# Patient Record
Sex: Female | Born: 1995 | Race: Black or African American | Hispanic: No | Marital: Single | Smoking: Light tobacco smoker
Health system: Southern US, Community
[De-identification: ages and names within clinical notes are randomized; demographics above are authoritative.]

## PROBLEM LIST (undated history)

## (undated) ENCOUNTER — Ambulatory Visit (HOSPITAL_COMMUNITY): Admission: EM | Discharge status: Absent without leave | Payer: Medicaid Other

## (undated) DIAGNOSIS — R109 Unspecified abdominal pain: Secondary | ICD-10-CM

## (undated) DIAGNOSIS — F1190 Opioid use, unspecified, uncomplicated: Secondary | ICD-10-CM

## (undated) DIAGNOSIS — F1220 Cannabis dependence, uncomplicated: Secondary | ICD-10-CM

## (undated) DIAGNOSIS — R51 Headache: Secondary | ICD-10-CM

## (undated) DIAGNOSIS — F913 Oppositional defiant disorder: Secondary | ICD-10-CM

## (undated) DIAGNOSIS — R768 Other specified abnormal immunological findings in serum: Secondary | ICD-10-CM

## (undated) DIAGNOSIS — R110 Nausea: Secondary | ICD-10-CM

## (undated) DIAGNOSIS — B182 Chronic viral hepatitis C: Secondary | ICD-10-CM

## (undated) DIAGNOSIS — L309 Dermatitis, unspecified: Secondary | ICD-10-CM

## (undated) DIAGNOSIS — F329 Major depressive disorder, single episode, unspecified: Secondary | ICD-10-CM

## (undated) DIAGNOSIS — F411 Generalized anxiety disorder: Secondary | ICD-10-CM

## (undated) DIAGNOSIS — R197 Diarrhea, unspecified: Secondary | ICD-10-CM

## (undated) DIAGNOSIS — F1199 Opioid use, unspecified with unspecified opioid-induced disorder: Secondary | ICD-10-CM

## (undated) DIAGNOSIS — F1990 Other psychoactive substance use, unspecified, uncomplicated: Secondary | ICD-10-CM

## (undated) DIAGNOSIS — F4310 Post-traumatic stress disorder, unspecified: Secondary | ICD-10-CM

## (undated) HISTORY — DX: Unspecified abdominal pain: R10.9

## (undated) HISTORY — DX: Nausea: R11.0

## (undated) HISTORY — PX: NO PAST SURGERIES: SHX2092

## (undated) HISTORY — DX: Diarrhea, unspecified: R19.7

---
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
## 2018-07-26 ENCOUNTER — Emergency Department (HOSPITAL_COMMUNITY)
Admission: EM | Admit: 2018-07-26 | Discharge: 2018-07-26 | Disposition: A | Discharge status: Home / Self Care | Payer: Self-pay | Attending: Emergency Medicine | Admitting: Emergency Medicine

## 2018-07-26 ENCOUNTER — Encounter (HOSPITAL_COMMUNITY): Payer: Self-pay | Admitting: Emergency Medicine

## 2018-07-26 DIAGNOSIS — F17210 Nicotine dependence, cigarettes, uncomplicated: Secondary | ICD-10-CM | POA: Insufficient documentation

## 2018-07-26 DIAGNOSIS — K029 Dental caries, unspecified: Secondary | ICD-10-CM | POA: Insufficient documentation

## 2018-07-26 MED ORDER — MELOXICAM 7.5 MG PO TABS: 7.5000 mg | ORAL_TABLET | Freq: Every day | ORAL | 0 refills | Status: AC

## 2018-07-26 MED ORDER — PENICILLIN V POTASSIUM 500 MG PO TABS: 500.0000 mg | ORAL_TABLET | Freq: Four times a day (QID) | ORAL | 0 refills | Status: AC

## 2018-07-26 NOTE — Discharge Instructions (Addendum)
Pain- try taking to a liquid gels along with 100 mg Tylenol every 6 hours.  If this does not help then take meloxicam as prescribed. Take penicillin as prescribed and complete the full course. Rinse and spit with Listerine or salt water after every meal. Continue to brush with a soft toothbrush. Up with a dentist as soon as possible, see resource list and discharge instructions.

## 2018-07-26 NOTE — ED Notes (Signed)
Patient able to ambulate independently  

## 2018-07-26 NOTE — ED Triage Notes (Signed)
Pt presents to ED for assessment of right lower dental pain x 3 days, hx of abscesses.

## 2018-07-26 NOTE — ED Provider Notes (Signed)
[facility] EMERGENCY DEPARTMENT Provider Note   CSN: [id number] Arrival date & time: 07/26/18  1807     History   Chief Complaint Chief Complaint  Patient presents with  . Dental Pain    HPI Molly Marshall is a 22 y.o. female.  22 year old female presents with complaint of right lower dental pain.  Patient states that she knows that she needs a root canal on this tooth however has been able to follow-up with the dentist, reports pain and swelling around the gums for the past 3 days, no drainage, trauma, no fever.  Denies difficulty opening her mouth or swallowing.  No other injuries, complaints or concerns.     Past Medical History:  Diagnosis Date  . Abdominal pain   . Diarrhea   . Eczema 08/11/2013  . Headache(784.0)   . Nausea     Patient Active Problem List   Diagnosis Date Noted  . Cannabis dependence (HCC) 01/31/2014  . Polysubstance abuse (HCC) 01/31/2014  . ODD (oppositional defiant disorder) 01/31/2014  . MDD (major depressive disorder), recurrent episode, moderate (HCC) 01/28/2014  . Chronic motor or vocal tic disorder 08/22/2013  . PTSD (post-traumatic stress disorder) 08/24/2012    Past Surgical History:  Procedure Laterality Date  . NO PAST SURGERIES       OB History   None      Home Medications    Prior to Admission medications   Medication Sig Start Date End Date Taking? Authorizing Provider  FLUoxetine (PROZAC) 20 MG capsule Take 1 capsule (20 mg total) by mouth at bedtime. 02/08/14   Chauncey Mann, MD  meloxicam (MOBIC) 7.5 MG tablet Take 1 tablet (7.5 mg total) by mouth daily for 10 days. 07/26/18 08/05/18  Jeannie Fend, PA-C  OLANZapine (ZYPREXA) 5 MG tablet Take 1 tablet (5 mg total) by mouth at bedtime. 02/08/14   Chauncey Mann, MD  penicillin v potassium (VEETID) 500 MG tablet Take 1 tablet (500 mg total) by mouth 4 (four) times daily for 10 days. 07/26/18 08/05/18  Jeannie Fend, PA-C    Family  History Family History  Problem Relation Age of Onset  . Mental illness Maternal Uncle   . Drug abuse Maternal Uncle     Social History Social History   Tobacco Use  . Smoking status: Current Some Day Smoker  . Smokeless tobacco: Never Used  Substance Use Topics  . Alcohol use: Yes  . Drug use: Yes    Types: Solvent inhalants, Marijuana, MDMA (Ecstacy)    Comment: On and off     Allergies   Patient has no known allergies.   Review of Systems Review of Systems  Constitutional: Negative for chills and fever.  HENT: Positive for dental problem. Negative for facial swelling, trouble swallowing and voice change.   Gastrointestinal: Negative for vomiting.  Musculoskeletal: Negative for neck pain and neck stiffness.  Skin: Negative for rash and wound.  Allergic/Immunologic: Negative for immunocompromised state.  Neurological: Negative for headaches.  Hematological: Negative for adenopathy.  Psychiatric/Behavioral: Negative for confusion.  All other systems reviewed and are negative.    Physical Exam Updated Vital Signs BP 129/85 (BP Location: Right Arm)   Pulse 92   Temp 98.5 F (36.9 C) (Oral)   Resp 14   SpO2 100%   Physical Exam  Constitutional: She is oriented to person, place, and time. She appears well-developed and well-nourished. No distress.  HENT:  Head: Normocephalic and atraumatic.  Mouth/Throat: Uvula is  midline, oropharynx is clear and moist and mucous membranes are normal. No trismus in the jaw. Abnormal dentition. Dental caries present. No dental abscesses. No oropharyngeal exudate.    Neck: Neck supple.  Pulmonary/Chest: Effort normal.  Lymphadenopathy:    She has no cervical adenopathy.  Neurological: She is alert and oriented to person, place, and time.  Skin: Skin is warm and dry. She is not diaphoretic.  Psychiatric: She has a normal mood and affect. Her behavior is normal.  Nursing note and vitals reviewed.    ED Treatments / Results    Labs (all labs ordered are listed, but only abnormal results are displayed) Labs Reviewed - No data to display  EKG None  Radiology No results found.  Procedures Procedures (including critical care time)  Medications Ordered in ED Medications - No data to display   Initial Impression / Assessment and Plan / ED Course  I have reviewed the triage vital signs and the nursing notes.  Pertinent labs & imaging results that were available during my care of the patient were reviewed by me and considered in my medical decision making (see chart for details).      Final Clinical Impressions(s) / ED Diagnoses   Final diagnoses:  Dental caries    ED Discharge Orders         Ordered    penicillin v potassium (VEETID) 500 MG tablet  4 times daily     07/26/18 1937    meloxicam (MOBIC) 7.5 MG tablet  Daily     07/26/18 1937           Alden HippMurphy, Edee Nifong A, PA-C 07/26/18 1941    Raeford RazorKohut, Stephen, MD 08/04/18 [id number]

## 2018-09-18 ENCOUNTER — Emergency Department (HOSPITAL_COMMUNITY): Payer: Self-pay

## 2018-09-18 ENCOUNTER — Emergency Department (HOSPITAL_COMMUNITY)
Admission: EM | Admit: 2018-09-18 | Discharge: 2018-09-18 | Disposition: A | Discharge status: Home / Self Care | Payer: Self-pay | Attending: Emergency Medicine | Admitting: Emergency Medicine

## 2018-09-18 ENCOUNTER — Other Ambulatory Visit: Payer: Self-pay

## 2018-09-18 ENCOUNTER — Encounter (HOSPITAL_COMMUNITY): Payer: Self-pay | Admitting: Emergency Medicine

## 2018-09-18 DIAGNOSIS — F17210 Nicotine dependence, cigarettes, uncomplicated: Secondary | ICD-10-CM | POA: Insufficient documentation

## 2018-09-18 DIAGNOSIS — J0300 Acute streptococcal tonsillitis, unspecified: Secondary | ICD-10-CM | POA: Insufficient documentation

## 2018-09-18 LAB — COMPREHENSIVE METABOLIC PANEL
ALK PHOS: 49 U/L (ref 38–126)
ALT: 11 U/L (ref 0–44)
AST: 17 U/L (ref 15–41)
Albumin: 3.2 g/dL — ABNORMAL LOW (ref 3.5–5.0)
Anion gap: 8 (ref 5–15)
BUN: 9 mg/dL (ref 6–20)
CHLORIDE: 103 mmol/L (ref 98–111)
CO2: 23 mmol/L (ref 22–32)
Calcium: 8.3 mg/dL — ABNORMAL LOW (ref 8.9–10.3)
Creatinine, Ser: 0.91 mg/dL (ref 0.44–1.00)
Glucose, Bld: 118 mg/dL — ABNORMAL HIGH (ref 70–99)
Potassium: 3.3 mmol/L — ABNORMAL LOW (ref 3.5–5.1)
Sodium: 134 mmol/L — ABNORMAL LOW (ref 135–145)
TOTAL PROTEIN: 6.1 g/dL — AB (ref 6.5–8.1)
Total Bilirubin: 0.5 mg/dL (ref 0.3–1.2)

## 2018-09-18 LAB — CBC WITH DIFFERENTIAL/PLATELET
ABS IMMATURE GRANULOCYTES: 0.24 10*3/uL — AB (ref 0.00–0.07)
BASOS PCT: 0 %
Basophils Absolute: 0.1 10*3/uL (ref 0.0–0.1)
Eosinophils Absolute: 0 10*3/uL (ref 0.0–0.5)
Eosinophils Relative: 0 %
HCT: 42.1 % (ref 36.0–46.0)
HEMOGLOBIN: 13.4 g/dL (ref 12.0–15.0)
IMMATURE GRANULOCYTES: 1 %
LYMPHS PCT: 6 %
Lymphs Abs: 1.6 10*3/uL (ref 0.7–4.0)
MCH: 28.5 pg (ref 26.0–34.0)
MCHC: 31.8 g/dL (ref 30.0–36.0)
MCV: 89.6 fL (ref 80.0–100.0)
MONOS PCT: 5 %
Monocytes Absolute: 1.3 10*3/uL — ABNORMAL HIGH (ref 0.1–1.0)
NEUTROS ABS: 22.3 10*3/uL — AB (ref 1.7–7.7)
Neutrophils Relative %: 88 %
Platelets: 315 10*3/uL (ref 150–400)
RBC: 4.7 MIL/uL (ref 3.87–5.11)
RDW: 13.4 % (ref 11.5–15.5)
WBC: 25.5 10*3/uL — AB (ref 4.0–10.5)
nRBC: 0 % (ref 0.0–0.2)

## 2018-09-18 LAB — HCG, QUANTITATIVE, PREGNANCY: hCG, Beta Chain, Quant, S: <1 m[IU]/mL (ref <5)

## 2018-09-18 LAB — I-STAT CG4 LACTIC ACID, ED: Lactic Acid, Venous: 1.54 mmol/L (ref 0.5–1.9)

## 2018-09-18 LAB — GROUP A STREP BY PCR: GROUP A STREP BY PCR: DETECTED — AB

## 2018-09-18 LAB — I-STAT BETA HCG BLOOD, ED (MC, WL, AP ONLY): I-stat hCG, quantitative: 13.8 m[IU]/mL — ABNORMAL HIGH (ref <5)

## 2018-09-18 MED ORDER — PREDNISONE 10 MG PO TABS: 40.0000 mg | ORAL_TABLET | Freq: Every day | ORAL | 0 refills | Status: DC

## 2018-09-18 MED ORDER — IOHEXOL 300 MG/ML  SOLN
75.0000 mL | Freq: Once | INTRAMUSCULAR | Status: AC | PRN
  Administered 2018-09-18: 75 mL via INTRAVENOUS

## 2018-09-18 MED ORDER — ONDANSETRON HCL 4 MG/2ML IJ SOLN
4.0000 mg | Freq: Once | INTRAMUSCULAR | Status: AC
  Administered 2018-09-18: 4 mg via INTRAVENOUS
  Filled 2018-09-18: qty 2

## 2018-09-18 MED ORDER — ACETAMINOPHEN 325 MG PO TABS
325.0000 mg | ORAL_TABLET | Freq: Once | ORAL | Status: AC
  Administered 2018-09-18: 325 mg via ORAL
  Filled 2018-09-18: qty 1

## 2018-09-18 MED ORDER — CLINDAMYCIN HCL 150 MG PO CAPS: 300.0000 mg | ORAL_CAPSULE | Freq: Four times a day (QID) | ORAL | 0 refills | Status: AC

## 2018-09-18 MED ORDER — CLINDAMYCIN PHOSPHATE 600 MG/50ML IV SOLN
600.0000 mg | Freq: Once | INTRAVENOUS | Status: DC
  Administered 2018-09-18: 600 mg via INTRAVENOUS
  Filled 2018-09-18: qty 50

## 2018-09-18 MED ORDER — ONDANSETRON HCL 4 MG PO TABS: 4.0000 mg | ORAL_TABLET | Freq: Four times a day (QID) | ORAL | 0 refills | Status: DC

## 2018-09-18 MED ORDER — CLINDAMYCIN PHOSPHATE 600 MG/50ML IV SOLN
600.0000 mg | Freq: Once | INTRAVENOUS | Status: AC
  Administered 2018-09-18: 600 mg via INTRAVENOUS

## 2018-09-18 MED ORDER — CLINDAMYCIN HCL 150 MG PO CAPS: 300.0000 mg | ORAL_CAPSULE | Freq: Four times a day (QID) | ORAL | 0 refills | Status: DC

## 2018-09-18 MED ORDER — SODIUM CHLORIDE 0.9 % IV BOLUS
1000.0000 mL | Freq: Once | INTRAVENOUS | Status: AC
  Administered 2018-09-18: 1000 mL via INTRAVENOUS

## 2018-09-18 MED ORDER — IBUPROFEN 800 MG PO TABS: 800.0000 mg | ORAL_TABLET | Freq: Three times a day (TID) | ORAL | 0 refills | Status: DC

## 2018-09-18 MED ORDER — MORPHINE SULFATE (PF) 4 MG/ML IV SOLN
4.0000 mg | Freq: Once | INTRAVENOUS | Status: AC
  Administered 2018-09-18: 4 mg via INTRAVENOUS
  Filled 2018-09-18: qty 1

## 2018-09-18 MED ORDER — METHYLPREDNISOLONE SODIUM SUCC 125 MG IJ SOLR
125.0000 mg | Freq: Once | INTRAMUSCULAR | Status: AC
  Administered 2018-09-18: 125 mg via INTRAVENOUS
  Filled 2018-09-18: qty 2

## 2018-09-18 NOTE — ED Notes (Signed)
Patient transported to X-ray 

## 2018-09-18 NOTE — ED Provider Notes (Signed)
[facility] EMERGENCY DEPARTMENT Provider Note   CSN: [id number] Arrival date & time: 09/18/18  1121     History   Chief Complaint Chief Complaint  Patient presents with  . Otalgia  . Neck Pain  . Fever    HPI Molly Marshall is a 22 y.o. female with a history of eczema who presents emergency department today for fever, sore throat and myalgias.  Patient reports that on Friday evening she began having a sore throat with associated dysphasia.  She reports that on Saturday she started developing a fever, chills, generalized body aches as well as worsening pain on the right side of her throat.  She reports since that time the area has become swollen and more difficult to swallow.  She reports she still is in control of her secretions and has no difficulty breathing.  The patient has been taking aspirin for symptoms without any relief.  She is unsure of sick contacts.  She denies any nasal congestion, sinus pressure, neck stiffness, rash, cough, chest pain, shortness breath, abdominal pain, nausea/vomiting/diarrhea or urinary symptoms.  She does complain of right-sided ear pain.  She believes this was from the swelling in her neck.  She denies any barotrauma or otorrhea.  HPI  Past Medical History:  Diagnosis Date  . Abdominal pain   . Diarrhea   . Eczema 08/11/2013  . Headache(784.0)   . Nausea     Patient Active Problem List   Diagnosis Date Noted  . Cannabis dependence (HCC) 01/31/2014  . Polysubstance abuse (HCC) 01/31/2014  . ODD (oppositional defiant disorder) 01/31/2014  . MDD (major depressive disorder), recurrent episode, moderate (HCC) 01/28/2014  . Chronic motor or vocal tic disorder 08/22/2013  . PTSD (post-traumatic stress disorder) 08/24/2012    Past Surgical History:  Procedure Laterality Date  . NO PAST SURGERIES       OB History   None      Home Medications    Prior to Admission medications   Medication Sig Start Date End  Date Taking? Authorizing Provider  FLUoxetine (PROZAC) 20 MG capsule Take 1 capsule (20 mg total) by mouth at bedtime. 02/08/14   Chauncey Mann, MD  OLANZapine (ZYPREXA) 5 MG tablet Take 1 tablet (5 mg total) by mouth at bedtime. 02/08/14   Chauncey Mann, MD    Family History Family History  Problem Relation Age of Onset  . Mental illness Maternal Uncle   . Drug abuse Maternal Uncle     Social History Social History   Tobacco Use  . Smoking status: Current Some Day Smoker  . Smokeless tobacco: Never Used  Substance Use Topics  . Alcohol use: Yes  . Drug use: Yes    Types: Solvent inhalants, Marijuana, MDMA (Ecstacy)    Comment: On and off     Allergies   Patient has no known allergies.   Review of Systems Review of Systems  All other systems reviewed and are negative.    Physical Exam Updated Vital Signs BP 119/75 (BP Location: Right Arm)   Pulse (!) 123   Temp (!) 102.9 F (39.4 C) (Oral)   Resp 16   SpO2 99%   Physical Exam  Constitutional: She appears well-developed and well-nourished.  HENT:  Head: Normocephalic and atraumatic.  Right Ear: Tympanic membrane, external ear and ear canal normal.  Left Ear: Tympanic membrane, external ear and ear canal normal.  Nose: Nose normal. Right sinus exhibits no maxillary sinus tenderness and no frontal  sinus tenderness. Left sinus exhibits no maxillary sinus tenderness and no frontal sinus tenderness.  Mouth/Throat: Uvula is midline, oropharynx is clear and moist and mucous membranes are normal. No tonsillar exudate.  The patient has muffled phonation but is in control of secretions. No stridor.  Uvula is deviated to the left and there is an evidence right PTA.  Tonsils are enlarged and the patient has erythema & exudates. Tongue protrusion is normal. No trismus. No creptius on neck palpation and patient has good dentition. No gingival erythema or fluctuance noted. Mucus membranes moist.   Eyes: Pupils are equal,  round, and reactive to light. Right eye exhibits no discharge. Left eye exhibits no discharge. No scleral icterus.  Neck: Trachea normal. Neck supple. No spinous process tenderness present. No neck rigidity. Normal range of motion present.  No nuchal rigidity or meningismus  Cardiovascular: Normal rate, regular rhythm and intact distal pulses.  No murmur heard. Pulses:      Radial pulses are 2+ on the right side, and 2+ on the left side.       Dorsalis pedis pulses are 2+ on the right side, and 2+ on the left side.       Posterior tibial pulses are 2+ on the right side, and 2+ on the left side.  Pulmonary/Chest: Effort normal and breath sounds normal. She exhibits no tenderness.  Abdominal: Soft. Bowel sounds are normal. There is no tenderness. There is no rebound and no guarding.  Musculoskeletal: She exhibits no edema.  Lymphadenopathy:    She has no cervical adenopathy.  Neurological: She is alert.  Skin: Skin is warm and dry. No rash noted. She is not diaphoretic.  Psychiatric: She has a normal mood and affect.  Nursing note and vitals reviewed.    ED Treatments / Results  Labs (all labs ordered are listed, but only abnormal results are displayed) Labs Reviewed  GROUP A STREP BY PCR - Abnormal; Notable for the following components:      Result Value   Group A Strep by PCR DETECTED (*)    All other components within normal limits  COMPREHENSIVE METABOLIC PANEL - Abnormal; Notable for the following components:   Sodium 134 (*)    Potassium 3.3 (*)    Glucose, Bld 118 (*)    Calcium 8.3 (*)    Total Protein 6.1 (*)    Albumin 3.2 (*)    All other components within normal limits  CBC WITH DIFFERENTIAL/PLATELET - Abnormal; Notable for the following components:   WBC 25.5 (*)    Neutro Abs 22.3 (*)    Monocytes Absolute 1.3 (*)    Abs Immature Granulocytes 0.24 (*)    All other components within normal limits  I-STAT BETA HCG BLOOD, ED ([facility], [facility], [facility] ONLY) - Abnormal; Notable  for the following components:   I-stat hCG, quantitative 13.8 (*)    All other components within normal limits  HCG, QUANTITATIVE, PREGNANCY  I-STAT CG4 LACTIC ACID, ED  I-STAT CG4 LACTIC ACID, ED    EKG None  Radiology Ct Soft Tissue Neck W Contrast  Result Date: 09/18/2018 CLINICAL DATA:  RIGHT-sided neck swelling.  Sore throat. EXAM: CT NECK WITH CONTRAST TECHNIQUE: Multidetector CT imaging of the neck was performed using the standard protocol following the bolus administration of intravenous contrast. CONTRAST:  75mL OMNIPAQUE IOHEXOL 300 MG/ML  SOLN COMPARISON:  None. FINDINGS: Pharynx and larynx: BILATERAL palatine tonsillitis, greater on the RIGHT. No tonsillar abscess or peritonsillar inflammation/abscess. Nasopharyngeal adenoidal hypertrophy. No  retropharyngeal effusion or abscess. Normal larynx. Airway midline. Salivary glands: No inflammation, mass, or stone. Thyroid: Normal. Lymph nodes: Bulky reactive BILATERAL cervical adenopathy, see for instance RIGHT level IIA up to 14 mm short axis Vascular: Negative. Limited intracranial: Negative. Visualized orbits: Negative. Mastoids and visualized paranasal sinuses: Clear. Skeleton: No acute or aggressive process. Upper chest: Negative. Other: None IMPRESSION: BILATERAL tonsillitis, greater on the RIGHT, with reactive adenopathy. No tonsillar abscess or peritonsillar inflammation/abscess. Electronically Signed   By: Elsie Stain M.D.   On: 09/18/2018 14:04    Procedures Procedures (including critical care time)  Medications Ordered in ED Medications  acetaminophen (TYLENOL) tablet 325 mg (325 mg Oral Given 09/18/18 1133)  morphine 4 MG/ML injection 4 mg (4 mg Intravenous Given 09/18/18 1217)  sodium chloride 0.9 % bolus 1,000 mL (0 mLs Intravenous Stopped 09/18/18 1402)  ondansetron (ZOFRAN) injection 4 mg (4 mg Intravenous Given 09/18/18 1218)  methylPREDNISolone sodium succinate (SOLU-MEDROL) 125 mg/2 mL injection 125 mg (125 mg  Intravenous Given 09/18/18 1218)  clindamycin (CLEOCIN) IVPB 600 mg (0 mg Intravenous Stopped 09/18/18 1402)  iohexol (OMNIPAQUE) 300 MG/ML solution 75 mL (75 mLs Intravenous Contrast Given 09/18/18 1337)     Initial Impression / Assessment and Plan / ED Course  I have reviewed the triage vital signs and the nursing notes.  Pertinent labs & imaging results that were available during my care of the patient were reviewed by me and considered in my medical decision making (see chart for details).     22 y.o. female presnting with sore throat. Patinet with muffled voice, concerning exam for PTA & also tonsillar exudate, cervical lymphadenopathy, & dysphagia. Will obtain labs and imaging.    Patient labs without evidence of sepsis. Hcg quant negative for pregnancy. CT with tonsillitis, not PTA or RPA. Strep positive. No intractable emesis. Will treat with clindamycin, steroids, ibuprofen and have follow-up with ENT.  Note given for work.  Return precautions discussed.  Patient appears safe for discharge.  Case discussed with my attending, Dr. Rush Landmark who is in agreement plan.  Final Clinical Impressions(s) / ED Diagnoses   Final diagnoses:  Strep tonsillitis    ED Discharge Orders         Ordered    predniSONE (DELTASONE) 10 MG tablet  Daily     09/18/18 1434    clindamycin (CLEOCIN) 150 MG capsule  4 times daily     09/18/18 1434    ibuprofen (ADVIL,MOTRIN) 800 MG tablet  3 times daily     09/18/18 1434    ondansetron (ZOFRAN) 4 MG tablet  Every 6 hours     09/18/18 1434           Jacinto Halim, PA-C 09/18/18 1435    Tegeler, Canary Brim, MD 09/18/18 [phone]

## 2018-09-18 NOTE — Discharge Instructions (Addendum)
Please read and follow all provided instructions.  Your diagnoses today include:  Strep Throat  Tests performed today include: Vital signs. See below for your results today.  Strep Test: This was positive CT scan that did not show signs of an abscess. This did show reactive adenopathy.  Pregnancy test - this was negative.   Medications prescribed:  Please take all of your antibiotics until finished!  It is very important that you complete the entire course of this medication or the strep may not completely be treated.  You may develop abdominal discomfort or diarrhea from the antibiotic.  You may help offset this with probiotics which you can buy or get in yogurt. Do not eat or take the probiotics until 2 hours after your antibiotic. Do not take your medicine if develop an itchy rash, swelling in your mouth or lips, or difficulty breathing.  Prednisone - This is an oral steroid.  This medication is best taken with food in the morning.  Please note that this medication can cause anxiety, mood swings, muscle fatigue, increased hunger, weight gain (sodium/fluid retention), poor sleep as well as other symptoms. If you are a diabetic, please monitor your blood sugars at home as this medication can increase your blood sugars.   For pain control you may take: 800mg  of ibuprofen (that is usually four 200mg  over the counter pills) up to 3 times a day (please take with food) and acetaminophen 975mg  (this is 3 normal strength, 325mg , over the counter pills) up to four times a day. Please do not take more than this. Do not drink alcohol or combine with other medications that have acetaminophen or Ibuprofen as an ingredient (Read the labels!).    Home care instructions:  This is a bacterial infection. Continue to stay well-hydrated. Gargle warm salt water and spit it out. Continued to alternate between Tylenol and ibuprofen for pain. May consider over-the-counter Benadryl for additional relief (decrease  secretions). Also discard your toothbrush and begin using a new one in 3 days. Follow-up with your primary care doctor in this week for recheck of ongoing symptoms.   Follow-up instructions: Please follow-up with your primary care provider in 2-3 days for follow up.  Please call ENT (ear, nose & throat) tomorrow to schedule an appointment for follow up   Return instructions:  Return to the ED sooner for worsening condition, inability to swallow, breathing difficulty, new concerns. If you develop worsening or new concerning symptoms you can return to the emergency department for re-evaluation.   Additional Information:  Your vital signs today were: BP 119/75 (BP Location: Right Arm)    Pulse (!) 123    Temp (!) 102.9 F (39.4 C) (Oral)    Resp 16    SpO2 99%  If your blood pressure (BP) was elevated above 135/85 this visit, please have this repeated by your doctor within one month. ---------------

## 2018-09-18 NOTE — ED Triage Notes (Signed)
Aching all over,  Rt side of neck swelling ears hurt sore throat

## 2018-09-26 DIAGNOSIS — J36 Peritonsillar abscess: Secondary | ICD-10-CM | POA: Insufficient documentation

## 2018-09-26 DIAGNOSIS — F17200 Nicotine dependence, unspecified, uncomplicated: Secondary | ICD-10-CM | POA: Insufficient documentation

## 2018-09-27 ENCOUNTER — Emergency Department (HOSPITAL_COMMUNITY)
Admission: EM | Admit: 2018-09-27 | Discharge: 2018-09-27 | Disposition: A | Discharge status: Home / Self Care | Payer: Medicaid Other | Attending: Emergency Medicine | Admitting: Emergency Medicine

## 2018-09-27 ENCOUNTER — Encounter (HOSPITAL_COMMUNITY): Payer: Self-pay

## 2018-09-27 ENCOUNTER — Emergency Department (HOSPITAL_COMMUNITY): Payer: Medicaid Other

## 2018-09-27 DIAGNOSIS — J36 Peritonsillar abscess: Secondary | ICD-10-CM

## 2018-09-27 MED ORDER — DEXAMETHASONE SODIUM PHOSPHATE 10 MG/ML IJ SOLN
10.0000 mg | Freq: Once | INTRAMUSCULAR | Status: AC
  Administered 2018-09-27: 10 mg via INTRAMUSCULAR
  Filled 2018-09-27: qty 1

## 2018-09-27 MED ORDER — IOPAMIDOL (ISOVUE-300) INJECTION 61%
75.0000 mL | Freq: Once | INTRAVENOUS | Status: AC | PRN
  Administered 2018-09-27: 75 mL via INTRAVENOUS

## 2018-09-27 MED ORDER — CLINDAMYCIN HCL 150 MG PO CAPS: 150.0000 mg | ORAL_CAPSULE | Freq: Four times a day (QID) | ORAL | 0 refills | Status: DC

## 2018-09-27 NOTE — Discharge Instructions (Addendum)
You were seen today for continued sore throat.  Continue clindamycin.  You developed a peritonsillar abscess.  This may drain on its own.  However, follow-up with ENT and they can also potentially drain in the clinic.  If you develop fevers, worsening swelling, difficulty swallowing you should be reevaluated.

## 2018-09-27 NOTE — ED Provider Notes (Signed)
[facility] EMERGENCY DEPARTMENT Provider Note   CSN: [id number] Arrival date & time: 09/26/18  2343     History   Chief Complaint Chief Complaint  Patient presents with  . Neck Pain    HPI Molly Marshall is a 22 y.o. female.  HPI  This is a 22 year old female who presents with persistent sore throat.  Patient was seen and evaluated on November 10.  At that time she was diagnosed with strep tonsillitis.  She had a CT scan that was negative for supurrative complication or peritonsillar abscess.  She finished her course of prednisone.  She is still taking clindamycin.  Patient reports that she has not had any subsequent fevers.  However over the last 1 to 2 days she has noted some increased swelling of the left side of her neck.  She reports that she "feels like something is draining."  She reports pain with swallowing but is able to swallow.  She is not currently taking any pain medication.  Pain radiates into her left ear.  Past Medical History:  Diagnosis Date  . Abdominal pain   . Diarrhea   . Eczema 08/11/2013  . Headache(784.0)   . Nausea     Patient Active Problem List   Diagnosis Date Noted  . Cannabis dependence (HCC) 01/31/2014  . Polysubstance abuse (HCC) 01/31/2014  . ODD (oppositional defiant disorder) 01/31/2014  . MDD (major depressive disorder), recurrent episode, moderate (HCC) 01/28/2014  . Chronic motor or vocal tic disorder 08/22/2013  . PTSD (post-traumatic stress disorder) 08/24/2012    Past Surgical History:  Procedure Laterality Date  . NO PAST SURGERIES       OB History   None      Home Medications    Prior to Admission medications   Medication Sig Start Date End Date Taking? Authorizing Provider  clindamycin (CLEOCIN) 150 MG capsule Take 1 capsule (150 mg total) by mouth every 6 (six) hours. 09/27/18   Tove Wideman, Mayer Masker, MD  FLUoxetine (PROZAC) 20 MG capsule Take 1 capsule (20 mg total) by mouth at  bedtime. Patient not taking: Reported on 09/27/2018 02/08/14   Chauncey Mann, MD  ibuprofen (ADVIL,MOTRIN) 800 MG tablet Take 1 tablet (800 mg total) by mouth 3 (three) times daily. Patient not taking: Reported on 09/27/2018 09/18/18   Maczis, Elmer Sow, PA-C  OLANZapine (ZYPREXA) 5 MG tablet Take 1 tablet (5 mg total) by mouth at bedtime. Patient not taking: Reported on 09/27/2018 02/08/14   Chauncey Mann, MD  ondansetron (ZOFRAN) 4 MG tablet Take 1 tablet (4 mg total) by mouth every 6 (six) hours. Patient not taking: Reported on 09/27/2018 09/18/18   Maczis, Elmer Sow, PA-C  predniSONE (DELTASONE) 10 MG tablet Take 4 tablets (40 mg total) by mouth daily. Patient not taking: Reported on 09/27/2018 09/19/18   Maczis, Elmer Sow, PA-C    Family History Family History  Problem Relation Age of Onset  . Mental illness Maternal Uncle   . Drug abuse Maternal Uncle     Social History Social History   Tobacco Use  . Smoking status: Current Some Day Smoker  . Smokeless tobacco: Never Used  Substance Use Topics  . Alcohol use: Yes  . Drug use: Yes    Types: Solvent inhalants, Marijuana, MDMA (Ecstacy)    Comment: On and off     Allergies   Patient has no known allergies.   Review of Systems Review of Systems  Constitutional: Negative for fever.  HENT: Positive for sore throat and trouble swallowing.   Respiratory: Negative for shortness of breath.   Cardiovascular: Negative for chest pain.  All other systems reviewed and are negative.    Physical Exam Updated Vital Signs BP 111/70   Pulse (!) 104   Temp 98.5 F (36.9 C) (Oral)   Resp 16   SpO2 97%   Physical Exam  Constitutional: She is oriented to person, place, and time. She appears well-developed and well-nourished. No distress.  HENT:  Head: Normocephalic and atraumatic.  Erythematous posterior oropharynx, no significant exudate noted, left greater than right tonsillar swelling, left tonsil with extensive  crater, no spontaneous drainage noted, on initial exam there was slight trismus but with pain control, patient opens her mouth without difficulty, no muffled voice noted  Eyes: Pupils are equal, round, and reactive to light.  Neck: Neck supple.  Cardiovascular: Normal rate, regular rhythm and normal heart sounds.  Pulmonary/Chest: Effort normal and breath sounds normal. No respiratory distress. She has no wheezes.  Abdominal: Soft. There is no tenderness.  Lymphadenopathy:    She has cervical adenopathy.  Neurological: She is alert and oriented to person, place, and time.  Skin: Skin is warm and dry.  Psychiatric: She has a normal mood and affect.  Nursing note and vitals reviewed.    ED Treatments / Results  Labs (all labs ordered are listed, but only abnormal results are displayed) Labs Reviewed - No data to display  EKG None  Radiology Ct Soft Tissue Neck W Contrast  Result Date: 09/27/2018 CLINICAL DATA:  Sore throat EXAM: CT NECK WITH CONTRAST TECHNIQUE: Multidetector CT imaging of the neck was performed using the standard protocol following the bolus administration of intravenous contrast. CONTRAST:  75mL ISOVUE-300 IOPAMIDOL (ISOVUE-300) INJECTION 61% COMPARISON:  09/18/2018 FINDINGS: Pharynx and larynx: There is continued enlargement of the adenoid and palatine tonsils. There is now a fluid collection at the left palatine tonsil that measures 17 x 15 x 16 mm. The hypopharynx and larynx are normal. No abnormality of the retropharyngeal space. Salivary glands: Normal. Thyroid: Normal Lymph nodes: Enlarged left level 2A lymph nodes measure up to 17 mm. Vascular: Normal Limited intracranial: Normal Visualized orbits: Normal Mastoids and visualized paranasal sinuses: Clear Skeleton: Unremarkable Upper chest: Clear Other: None IMPRESSION: 1. Persistent enlargement of the adenoid and palatine tonsils with development of a left palatine peritonsillar abscess measuring 17 x 15 x 16 mm. 2.  Reactive left cervical lymphadenopathy. Electronically Signed   By: Deatra RobinsonKevin  Herman M.D.   On: 09/27/2018 02:49    Procedures .Marland Kitchen.Incision and Drainage Date/Time: 09/27/2018 4:12 AM Performed by: Shon BatonHorton, Siddarth Hsiung F, MD Authorized by: Shon BatonHorton, Shinita Mac F, MD   Consent:    Consent obtained:  Verbal   Consent given by:  Patient   Risks discussed:  Bleeding, incomplete drainage and damage to other organs   Alternatives discussed:  No treatment Location:    Indications for incision and drainage: Peritonsillar abscess.   Size:  1.5   Location:  Mouth   Mouth location:  Peritonsillar Anesthesia (see MAR for exact dosages):    Anesthesia method:  Topical application   Topical anesthesia: Cetacaine. Procedure type:    Complexity:  Simple Procedure details:    Needle aspiration: yes     Needle size:  22 G   Drainage amount: Unsuccessful. Post-procedure details:    Patient tolerance of procedure:  Tolerated well, no immediate complications Comments:     Attempt at needle aspiration x2 without any aspiration  of purulent material   (including critical care time)  Medications Ordered in ED Medications  dexamethasone (DECADRON) injection 10 mg (10 mg Intramuscular Given 09/27/18 0115)  iopamidol (ISOVUE-300) 61 % injection 75 mL (75 mLs Intravenous Contrast Given 09/27/18 0206)     Initial Impression / Assessment and Plan / ED Course  I have reviewed the triage vital signs and the nursing notes.  Pertinent labs & imaging results that were available during my care of the patient were reviewed by me and considered in my medical decision making (see chart for details).  Clinical Course as of Sep 27 400  Tue Sep 27, 2018  1610 Attempted needle aspiration to but was unsuccessful.  Patient tolerated the procedure well.  I did discuss the patient with Dr. Jearld Fenton.  Given that he is she is clinically well-appearing, will continue clindamycin and have her follow-up in the office for possible  aspiration.  She may also improve on her own.  I discussed this with the patient.  She is comfortable plan.  She was given a prolonged course of clindamycin and ENT follow-up.   [CH]    Clinical Course User Index [CH] Kyrah Schiro, Mayer Masker, MD    Patient presents with persistent sore throat.  Appropriately treated with prednisone and clindamycin for strep tonsillitis.  Clinically she has no further exudate but does have asymmetric tonsillar swelling.  There is no obvious draining peritonsillar abscess on exam and she has extensive crater in the left tonsil.  She is very well-appearing and afebrile.  Nontoxic.  ABCs intact.  She was given 1 additional dose of Decadron.  Repeat CT scan shows development of a 1.5 cm abscess.  Attempted aspiration at the bedside which the patient tolerated well.  However, unable to successfully aspirate.  Clinically she is well-appearing in some aspects of her oropharyngeal exam appear improved when compared to documentation from prior visit.  However, she has developed this peritonsillar abscess on clindamycin.  I did discuss with ENT on-call.  He agrees that patient likely does not need admission for IV antibiotics.  She can follow-up in the office for possible drainage or wait and see if this drains on her own.  I extended patient's antibiotic course from 7 days to 14.  She was given strict return precautions.  After history, exam, and medical workup I feel the patient has been appropriately medically screened and is safe for discharge home. Pertinent diagnoses were discussed with the patient. Patient was given return precautions.   Final Clinical Impressions(s) / ED Diagnoses   Final diagnoses:  Peritonsillar abscess    ED Discharge Orders         Ordered    clindamycin (CLEOCIN) 150 MG capsule  Every 6 hours     09/27/18 0400           Lessie Funderburke, Mayer Masker, MD 09/27/18 [phone]

## 2018-09-27 NOTE — ED Triage Notes (Signed)
Pt states that she was here the other day for a dental abscess to the Rt side and today she began to having some swelling and pain to the L side of neck. Denies throat pain, diagnosed with strep last week

## 2018-09-29 DIAGNOSIS — J36 Peritonsillar abscess: Secondary | ICD-10-CM | POA: Insufficient documentation

## 2018-09-29 DIAGNOSIS — F17200 Nicotine dependence, unspecified, uncomplicated: Secondary | ICD-10-CM | POA: Insufficient documentation

## 2018-09-30 ENCOUNTER — Other Ambulatory Visit: Payer: Self-pay

## 2018-09-30 ENCOUNTER — Emergency Department (HOSPITAL_COMMUNITY)
Admission: EM | Admit: 2018-09-30 | Discharge: 2018-09-30 | Disposition: A | Discharge status: Home / Self Care | Payer: Medicaid Other | Attending: Emergency Medicine | Admitting: Emergency Medicine

## 2018-09-30 ENCOUNTER — Encounter (HOSPITAL_COMMUNITY): Payer: Self-pay | Admitting: Emergency Medicine

## 2018-09-30 DIAGNOSIS — J36 Peritonsillar abscess: Secondary | ICD-10-CM

## 2018-09-30 MED ORDER — SODIUM CHLORIDE 0.9 % IV BOLUS (SEPSIS)
1000.0000 mL | Freq: Once | INTRAVENOUS | Status: AC
  Administered 2018-09-30: 1000 mL via INTRAVENOUS

## 2018-09-30 MED ORDER — DEXAMETHASONE SODIUM PHOSPHATE 10 MG/ML IJ SOLN
10.0000 mg | Freq: Once | INTRAMUSCULAR | Status: AC
  Administered 2018-09-30: 10 mg via INTRAVENOUS
  Filled 2018-09-30: qty 1

## 2018-09-30 MED ORDER — KETOROLAC TROMETHAMINE 30 MG/ML IJ SOLN
30.0000 mg | Freq: Once | INTRAMUSCULAR | Status: AC
  Administered 2018-09-30: 30 mg via INTRAVENOUS
  Filled 2018-09-30: qty 1

## 2018-09-30 MED ORDER — CLINDAMYCIN PHOSPHATE 600 MG/50ML IV SOLN
600.0000 mg | Freq: Once | INTRAVENOUS | Status: AC
  Administered 2018-09-30: 600 mg via INTRAVENOUS
  Filled 2018-09-30: qty 50

## 2018-09-30 NOTE — Discharge Instructions (Addendum)
You may alternate Tylenol 1000 mg every 6 hours as needed for pain and Ibuprofen 800 mg every 8 hours as needed for pain.  Please take Ibuprofen with food.   Please continue your clindamycin.

## 2018-09-30 NOTE — ED Provider Notes (Signed)
TIME SEEN: 12:25 AM  CHIEF COMPLAINT: Sore throat  HPI: Patient is a 22 year old female who presents to the emergency department with sore throat for the past 2 weeks.  Was seen here on 09/18/2018 and was positive for strep throat and CT scan showed tonsillitis.  Came back on 09/27/2018 and had a repeat CT scan that showed a right peritonsillar abscess measuring 17 x 15 x 16 mm.  Was put on clindamycin and given Decadron.  States symptoms feel like they are getting worse.  She has not contacted ENT for follow-up.  Has a hard time opening her mouth secondary to pain.  Pain with swallowing.  No difficulty breathing.  Last fever was over a week ago.  She is still on antibiotics.  ROS: See HPI Constitutional: no fever  Eyes: no drainage  ENT: no runny nose   Cardiovascular:  no chest pain  Resp: no SOB  GI: no vomiting GU: no dysuria Integumentary: no rash  Allergy: no hives  Musculoskeletal: no leg swelling  Neurological: no slurred speech ROS otherwise negative  PAST MEDICAL HISTORY/PAST SURGICAL HISTORY:  Past Medical History:  Diagnosis Date  . Abdominal pain   . Diarrhea   . Eczema 08/11/2013  . Headache(784.0)   . Nausea     MEDICATIONS:  Prior to Admission medications   Medication Sig Start Date End Date Taking? Authorizing Provider  clindamycin (CLEOCIN) 150 MG capsule Take 1 capsule (150 mg total) by mouth every 6 (six) hours. 09/27/18   Horton, Mayer Maskerourtney F, MD  FLUoxetine (PROZAC) 20 MG capsule Take 1 capsule (20 mg total) by mouth at bedtime. Patient not taking: Reported on 09/27/2018 02/08/14   Chauncey MannJennings, Glenn E, MD  ibuprofen (ADVIL,MOTRIN) 800 MG tablet Take 1 tablet (800 mg total) by mouth 3 (three) times daily. Patient not taking: Reported on 09/27/2018 09/18/18   Maczis, Elmer SowMichael M, PA-C  OLANZapine (ZYPREXA) 5 MG tablet Take 1 tablet (5 mg total) by mouth at bedtime. Patient not taking: Reported on 09/27/2018 02/08/14   Chauncey MannJennings, Glenn E, MD  ondansetron (ZOFRAN) 4 MG  tablet Take 1 tablet (4 mg total) by mouth every 6 (six) hours. Patient not taking: Reported on 09/27/2018 09/18/18   Maczis, Elmer SowMichael M, PA-C  predniSONE (DELTASONE) 10 MG tablet Take 4 tablets (40 mg total) by mouth daily. Patient not taking: Reported on 09/27/2018 09/19/18   Jacinto HalimMaczis, Michael M, PA-C    ALLERGIES:  No Known Allergies  SOCIAL HISTORY:  Social History   Tobacco Use  . Smoking status: Current Some Day Smoker  . Smokeless tobacco: Never Used  Substance Use Topics  . Alcohol use: Yes    FAMILY HISTORY: Family History  Problem Relation Age of Onset  . Mental illness Maternal Uncle   . Drug abuse Maternal Uncle     EXAM: BP 120/83 (BP Location: Right Arm)   Pulse 87   Temp (!) 97.5 F (36.4 C) (Oral)   Resp 18   Ht 5\' 5"  (1.651 m)   Wt 75.5 kg   SpO2 100%   BMI 27.70 kg/m  CONSTITUTIONAL: Alert and oriented and responds appropriately to questions. Well-appearing; well-nourished HEAD: Normocephalic EYES: Conjunctivae clear, pupils appear equal, EOMI ENT: normal nose; moist mucous membranes, patient has deviation of the uvula to the left with asymmetric swelling noted around the right tonsil, mild bilateral tonsillar hypertrophy without significant exudate noted, no active drainage, dental caries noted without dental abscess, mild trismus on exam, slightly muffled voice, no stridor, no drooling NECK:  Supple, no meningismus, no nuchal rigidity, no LAD  CARD: RRR; S1 and S2 appreciated; no murmurs, no clicks, no rubs, no gallops RESP: Normal chest excursion without splinting or tachypnea; breath sounds clear and equal bilaterally; no wheezes, no rhonchi, no rales, no hypoxia or respiratory distress, speaking full sentences ABD/GI: Normal bowel sounds; non-distended; soft, non-tender, no rebound, no guarding, no peritoneal signs, no hepatosplenomegaly BACK:  The back appears normal and is non-tender to palpation, there is no CVA tenderness EXT: Normal ROM in all  joints; non-tender to palpation; no edema; normal capillary refill; no cyanosis, no calf tenderness or swelling    SKIN: Normal color for age and race; warm; no rash NEURO: Moves all extremities equally PSYCH: The patient's mood and manner are appropriate. Grooming and personal hygiene are appropriate.  MEDICAL DECISION MAKING: Patient here with known PTA.  Drainage attempted in the ED on her last visit on the 19th.  Drainage was unsuccessful.  It appears Dr. Jearld Fenton with ENT was consulted and recommended outpatient follow-up.  Patient has not called to schedule an appointment.  States symptoms feel like they are worsening today.  She is nontoxic-appearing with no signs of airway involvement.  I do not feel she needs repeat CT imaging today.  Will give IV fluids, clindamycin, Toradol, Decadron for symptomatic relief and reassess.  She states she is comfortable with this plan for symptomatic treatment and plans to call the ENT office in the morning.  ED PROGRESS: 1:55 AM  Pt's symptoms have significantly improved.  She is now able to open her mouth fully and her muffled voice is resolved.  I feel she is safe to be discharged with close ENT follow-up as an outpatient and she is comfortable with this plan as well.  ENT was consulted during her last visit on the 19th and is aware of patient.  We will give her follow-up information.  We have discussed at length return precautions.  She is comfortable with this plan.   At this time, I do not feel there is any life-threatening condition present. I have reviewed and discussed all results (EKG, imaging, lab, urine as appropriate) and exam findings with patient/family. I have reviewed nursing notes and appropriate previous records.  I feel the patient is safe to be discharged home without further emergent workup and can continue workup as an outpatient as needed. Discussed usual and customary return precautions. Patient/family verbalize understanding and are  comfortable with this plan.  Outpatient follow-up has been provided if needed. All questions have been answered.      Shaylin Blatt, Layla Maw, DO 09/30/18 0157

## 2018-09-30 NOTE — ED Notes (Signed)
Fluid given.

## 2018-09-30 NOTE — ED Triage Notes (Signed)
Per pt she was here last night and had this abscess and they tried to drain it. No success. Pt was told to come back if any thing got worse. She was advised to go to ENT but can't get in for a week. Pt says it is hard to swallow.

## 2019-03-10 ENCOUNTER — Emergency Department (HOSPITAL_COMMUNITY): Payer: Self-pay

## 2019-03-10 ENCOUNTER — Encounter (HOSPITAL_COMMUNITY): Payer: Self-pay

## 2019-03-10 ENCOUNTER — Emergency Department (HOSPITAL_COMMUNITY)
Admission: EM | Admit: 2019-03-10 | Discharge: 2019-03-10 | Disposition: A | Discharge status: Home / Self Care | Payer: Self-pay | Attending: Emergency Medicine | Admitting: Emergency Medicine

## 2019-03-10 ENCOUNTER — Other Ambulatory Visit: Payer: Self-pay

## 2019-03-10 DIAGNOSIS — T507X1A Poisoning by analeptics and opioid receptor antagonists, accidental (unintentional), initial encounter: Secondary | ICD-10-CM | POA: Insufficient documentation

## 2019-03-10 DIAGNOSIS — F17210 Nicotine dependence, cigarettes, uncomplicated: Secondary | ICD-10-CM | POA: Insufficient documentation

## 2019-03-10 DIAGNOSIS — J690 Pneumonitis due to inhalation of food and vomit: Secondary | ICD-10-CM

## 2019-03-10 DIAGNOSIS — Z79899 Other long term (current) drug therapy: Secondary | ICD-10-CM | POA: Insufficient documentation

## 2019-03-10 DIAGNOSIS — T40601A Poisoning by unspecified narcotics, accidental (unintentional), initial encounter: Secondary | ICD-10-CM

## 2019-03-10 DIAGNOSIS — J189 Pneumonia, unspecified organism: Secondary | ICD-10-CM | POA: Insufficient documentation

## 2019-03-10 LAB — RAPID URINE DRUG SCREEN, HOSP PERFORMED
Amphetamines: NOT DETECTED
Barbiturates: NOT DETECTED
Benzodiazepines: NOT DETECTED
Cocaine: NOT DETECTED
Opiates: POSITIVE — AB
Tetrahydrocannabinol: POSITIVE — AB

## 2019-03-10 LAB — I-STAT BETA HCG BLOOD, ED (MC, WL, AP ONLY): I-stat hCG, quantitative: <5 m[IU]/mL (ref <5)

## 2019-03-10 LAB — COMPREHENSIVE METABOLIC PANEL
ALT: 144 U/L — ABNORMAL HIGH (ref 0–44)
AST: 66 U/L — ABNORMAL HIGH (ref 15–41)
Albumin: 3.5 g/dL (ref 3.5–5.0)
Alkaline Phosphatase: 83 U/L (ref 38–126)
Anion gap: 8 (ref 5–15)
BUN: 11 mg/dL (ref 6–20)
CO2: 29 mmol/L (ref 22–32)
Calcium: 8.6 mg/dL — ABNORMAL LOW (ref 8.9–10.3)
Chloride: 102 mmol/L (ref 98–111)
Creatinine, Ser: 0.86 mg/dL (ref 0.44–1.00)
GFR calc Af Amer: >60 mL/min (ref >60)
GFR calc non Af Amer: >60 mL/min (ref >60)
Glucose, Bld: 129 mg/dL — ABNORMAL HIGH (ref 70–99)
Potassium: 4.2 mmol/L (ref 3.5–5.1)
Sodium: 139 mmol/L (ref 135–145)
Total Bilirubin: 0.5 mg/dL (ref 0.3–1.2)
Total Protein: 6.7 g/dL (ref 6.5–8.1)

## 2019-03-10 LAB — CBC
HCT: 41.2 % (ref 36.0–46.0)
Hemoglobin: 13.4 g/dL (ref 12.0–15.0)
MCH: 29.2 pg (ref 26.0–34.0)
MCHC: 32.5 g/dL (ref 30.0–36.0)
MCV: 89.8 fL (ref 80.0–100.0)
Platelets: 412 10*3/uL — ABNORMAL HIGH (ref 150–400)
RBC: 4.59 MIL/uL (ref 3.87–5.11)
RDW: 14.5 % (ref 11.5–15.5)
WBC: 23.4 10*3/uL — ABNORMAL HIGH (ref 4.0–10.5)
nRBC: 0 % (ref 0.0–0.2)

## 2019-03-10 LAB — ETHANOL: Alcohol, Ethyl (B): <10 mg/dL (ref <10)

## 2019-03-10 LAB — SALICYLATE LEVEL: Salicylate Lvl: <7 mg/dL (ref 2.8–30.0)

## 2019-03-10 LAB — ACETAMINOPHEN LEVEL: Acetaminophen (Tylenol), Serum: <10 ug/mL — ABNORMAL LOW (ref 10–30)

## 2019-03-10 LAB — CBG MONITORING, ED: Glucose-Capillary: 115 mg/dL — ABNORMAL HIGH (ref 70–99)

## 2019-03-10 MED ORDER — METOCLOPRAMIDE HCL 5 MG/ML IJ SOLN
10.0000 mg | Freq: Once | INTRAMUSCULAR | Status: AC
  Administered 2019-03-10: 10 mg via INTRAVENOUS
  Filled 2019-03-10: qty 2

## 2019-03-10 MED ORDER — NALOXONE HCL 4 MG/0.1ML NA LIQD
1.0000 | Freq: Once | NASAL | Status: DC
  Filled 2019-03-10: qty 4

## 2019-03-10 MED ORDER — HYDROMORPHONE HCL 1 MG/ML IJ SOLN: 1.0000 mg | Freq: Once | INTRAMUSCULAR | Status: DC

## 2019-03-10 MED ORDER — AMOXICILLIN-POT CLAVULANATE 875-125 MG PO TABS: 1.0000 | ORAL_TABLET | Freq: Two times a day (BID) | ORAL | 0 refills | Status: DC

## 2019-03-10 MED ORDER — SODIUM CHLORIDE 0.9 % IV SOLN
3.0000 g | Freq: Once | INTRAVENOUS | Status: AC
  Administered 2019-03-10: 19:00:00 3 g via INTRAVENOUS
  Filled 2019-03-10: qty 3

## 2019-03-10 NOTE — ED Notes (Signed)
Reviewed discharge and medication instructions with the patient. Patient verbalizes understanding and has no questions at this time.

## 2019-03-10 NOTE — ED Notes (Signed)
Pt ambulated to the restroom without difficulty or assistance. She states that she will attempt to obtain a urine sample.

## 2019-03-10 NOTE — ED Notes (Signed)
Bed: RESA Expected date:  Expected time:  Means of arrival:  Comments: EMS/O.D. 

## 2019-03-10 NOTE — Discharge Instructions (Signed)
If you develop shortness of breath, coughing up blood, or any other new/concerning symptoms then return to the ER for evaluation.

## 2019-03-10 NOTE — ED Provider Notes (Signed)
[facility]-EMERGENCY DEPT Provider Note   CSN: [id number] Arrival date & time: 03/10/19  1617  LEVEL 5 CAVEAT - INTOXICATION  History   Chief Complaint Chief Complaint  Patient presents with  . Drug Overdose    HPI Molly Marshall is a 23 y.o. female.     HPI  23 year old female presents after being found unresponsive and with emesis.  Per EMS report, her O2 sats were 35%.  She was given intranasal Narcan and became more awake and alert.  She is still somewhat drowsy.  She states she was not intending to hurt herself but was snorting oxycodone.  She is currently on 4 L simple facemask for continued hypoxia.  She denies being ill recently including no cough or fever. Denies any co-ingestants.  Past Medical History:  Diagnosis Date  . Abdominal pain   . Diarrhea   . Eczema 08/11/2013  . Headache(784.0)   . Nausea     Patient Active Problem List   Diagnosis Date Noted  . Cannabis dependence (HCC) 01/31/2014  . Polysubstance abuse (HCC) 01/31/2014  . ODD (oppositional defiant disorder) 01/31/2014  . MDD (major depressive disorder), recurrent episode, moderate (HCC) 01/28/2014  . Chronic motor or vocal tic disorder 08/22/2013  . PTSD (post-traumatic stress disorder) 08/24/2012    Past Surgical History:  Procedure Laterality Date  . NO PAST SURGERIES       OB History   No obstetric history on file.      Home Medications    Prior to Admission medications   Medication Sig Start Date End Date Taking? Authorizing Provider  clindamycin (CLEOCIN) 150 MG capsule Take 1 capsule (150 mg total) by mouth every 6 (six) hours. 09/27/18   Horton, Mayer Masker, MD  FLUoxetine (PROZAC) 20 MG capsule Take 1 capsule (20 mg total) by mouth at bedtime. Patient not taking: Reported on 09/27/2018 02/08/14   Chauncey Mann, MD  ibuprofen (ADVIL,MOTRIN) 800 MG tablet Take 1 tablet (800 mg total) by mouth 3 (three) times daily. Patient not taking: Reported on  09/27/2018 09/18/18   Maczis, Elmer Sow, PA-C  OLANZapine (ZYPREXA) 5 MG tablet Take 1 tablet (5 mg total) by mouth at bedtime. Patient not taking: Reported on 09/27/2018 02/08/14   Chauncey Mann, MD  ondansetron (ZOFRAN) 4 MG tablet Take 1 tablet (4 mg total) by mouth every 6 (six) hours. Patient not taking: Reported on 09/27/2018 09/18/18   Maczis, Elmer Sow, PA-C  predniSONE (DELTASONE) 10 MG tablet Take 4 tablets (40 mg total) by mouth daily. Patient not taking: Reported on 09/27/2018 09/19/18   Maczis, Elmer Sow, PA-C    Family History Family History  Problem Relation Age of Onset  . Mental illness Maternal Uncle   . Drug abuse Maternal Uncle     Social History Social History   Tobacco Use  . Smoking status: Current Some Day Smoker  . Smokeless tobacco: Never Used  Substance Use Topics  . Alcohol use: Yes  . Drug use: Yes    Types: Solvent inhalants, Marijuana, MDMA (Ecstacy)    Comment: On and off     Allergies   Patient has no known allergies.   Review of Systems Review of Systems  Unable to perform ROS: Mental status change     Physical Exam Updated Vital Signs BP 107/72 (BP Location: Right Arm)   Pulse (!) 108   Temp 97.7 F (36.5 C) (Axillary)   Resp (!) 22   Ht  (1.575 m)  Wt 65.8 kg   LMP  (LMP Unknown)   SpO2 92% Comment: Simultaneous filing. User may not have seen previous data.  BMI 26.52 kg/m   Physical Exam Vitals signs and nursing note reviewed.  Constitutional:      Appearance: She is well-developed.     Comments: Patient is somnolent but easily arouses to voice. She can mostly keep up a conversation. If left alone quickly falls back asleep but maintains good respiratory effort  HENT:     Head: Normocephalic and atraumatic.     Right Ear: External ear normal.     Left Ear: External ear normal.     Nose: Nose normal.  Eyes:     General:        Right eye: No discharge.        Left eye: No discharge.  Cardiovascular:      Rate and Rhythm: Regular rhythm. Tachycardia present.     Heart sounds: Normal heart sounds.  Pulmonary:     Effort: Pulmonary effort is normal.     Breath sounds: Normal breath sounds.     Comments: RR 18 On face mask at 5L, O2 sats 100% Abdominal:     Palpations: Abdomen is soft.     Tenderness: There is no abdominal tenderness.  Skin:    General: Skin is warm and dry.  Psychiatric:        Mood and Affect: Mood is not anxious.      ED Treatments / Results  Labs (all labs ordered are listed, but only abnormal results are displayed) Labs Reviewed  COMPREHENSIVE METABOLIC PANEL - Abnormal; Notable for the following components:      Result Value   Glucose, Bld 129 (*)    Calcium 8.6 (*)    AST 66 (*)    ALT 144 (*)    All other components within normal limits  CBC - Abnormal; Notable for the following components:   WBC 23.4 (*)    Platelets 412 (*)    All other components within normal limits  RAPID URINE DRUG SCREEN, HOSP PERFORMED - Abnormal; Notable for the following components:   Opiates POSITIVE (*)    Tetrahydrocannabinol POSITIVE (*)    All other components within normal limits  ACETAMINOPHEN LEVEL - Abnormal; Notable for the following components:   Acetaminophen (Tylenol), Serum <10 (*)    All other components within normal limits  CBG MONITORING, ED - Abnormal; Notable for the following components:   Glucose-Capillary 115 (*)    All other components within normal limits  ETHANOL  SALICYLATE LEVEL  I-STAT BETA HCG BLOOD, ED ([facility], [facility], [facility] ONLY)    EKG EKG Interpretation  Date/Time:  Friday Mar 10 2019 16:34:13 EDT Ventricular Rate:  91 PR Interval:    QRS Duration: 68 QT Interval:  454 QTC Calculation: 559 R Axis:   50 Text Interpretation:  Sinus rhythm Borderline T abnormalities, diffuse leads Prolonged QT interval No old tracing to compare Confirmed by Pricilla LovelessGoldston, Kayzen Kendzierski [phone]([id number]) on 03/10/2019 4:38:53 PM   Radiology Dg Chest Portable 1 View  Result Date:  03/10/2019 CLINICAL DATA:  Hypoxia, overdose, started 30 mg Oxycodone while smoking marijuana at 1500 hours, was unresponsive, vomited, decreased oxygen saturation, responded to Narcan, history smoking EXAM: PORTABLE CHEST 1 VIEW COMPARISON:  Portable exam 1634 hours without priors for comparison FINDINGS: Normal heart size, mediastinal contours, and pulmonary vascularity. Subtle LEFT perihilar opacity question infiltrate versus aspiration. Remaining lungs clear. No pleural effusion or pneumothorax. Bones unremarkable. IMPRESSION:  Subtle perihilar infiltrate LEFT upper lobe which could represent pneumonia or aspiration. Remaining lungs clear. Electronically Signed   By: Ulyses Southward M.D.   On: 03/10/2019 17:11    Procedures Procedures (including critical care time)  Medications Ordered in ED Medications  naloxone (NARCAN) nasal spray 4 mg/0.1 mL (has no administration in time range)  Ampicillin-Sulbactam (UNASYN) 3 g in sodium chloride 0.9 % 100 mL IVPB (0 g Intravenous Stopped 03/10/19 2001)  metoCLOPramide (REGLAN) injection 10 mg (10 mg Intravenous Given 03/10/19 1853)     Initial Impression / Assessment and Plan / ED Course  I have reviewed the triage vital signs and the nursing notes.  Pertinent labs & imaging results that were available during my care of the patient were reviewed by me and considered in my medical decision making (see chart for details).        Patient was observed and appeared to sober.  She had some nausea and vomiting but this has improved with Reglan.  Her labs indicate mildly elevated LFTs but also significantly elevated WBC.  I think this is probably a stress response from near death and vomiting.  She does appear to have mild aspiration pneumonitis versus pneumonia.  I think in the clinical setting I will treat her with Augmentin.  She was given a dose of Unasyn.  On recheck, heart rate is in the 90s and her oxygen has been weaned off and she ambulated without  difficulty.  She appears stable for outpatient management.  She was given intranasal Narcan to take at home as needed.  Encouraged to stop her illicit drug use.  Final Clinical Impressions(s) / ED Diagnoses   Final diagnoses:  Opiate overdose, accidental or unintentional, initial encounter [facility])  Aspiration pneumonia of left upper lobe due to vomit [facility])    ED Discharge Orders    None       Pricilla Loveless, MD 03/10/19 2214

## 2019-03-10 NOTE — ED Triage Notes (Signed)
Patient BIB EMS. Patient reports she snorted 30mg  Roxicodone while smoking marijuana at approximately 1500. Patient was unresponsive and covered in vomit, with oxygen saturation at 35% on RA upon EMS arrival. Patient was given 0.5mg  IN Narcan by EMS at approx 1520. Patient became alert and oriented upon waking. Patient is drowsy, but AxOx4 in triage. Patient reports the overdose was unintentional. Patient states she "does pain meds occasionally." Patient denies SI/HI. EMS suspected aspiration of vomitous. Rhonchi noted to bilateral lungs. Patient on 4L simple mask oxygen at this time to keep oxygen saturations above 90%.   20G Left AC PIV placed by EMS. EMS CBG= 124.

## 2019-03-10 NOTE — ED Notes (Signed)
EDMD at bedside

## 2019-09-21 ENCOUNTER — Telehealth: Payer: Self-pay | Admitting: Pharmacy Technician

## 2019-09-21 NOTE — Telephone Encounter (Signed)
[facility] Patient Teacher, English as a foreign language completed.    The patient is uninsured (has [place] Medicaid that does not cover needed medication.  There may be [employer],but not able to confirm therefore will need to see insurance card) and will need patient assistance for medication.  We can complete the application and will need to meet with the patient for signatures and income documentation.  Molly Marshall. [name] [place] Patient [facility] for Infectious Disease Phone: [phone] Fax:  [phone]

## 2019-09-22 ENCOUNTER — Encounter: Payer: Medicaid Other | Admitting: Infectious Diseases

## 2019-09-26 ENCOUNTER — Encounter: Payer: Medicaid Other | Admitting: Infectious Diseases

## 2019-09-28 ENCOUNTER — Telehealth: Payer: Self-pay

## 2019-09-28 NOTE — Telephone Encounter (Signed)
COVID-19 Pre-Screening Questions:09/28/19   Do you currently have a fever (>100 F), chills or unexplained body aches? NO   Are you currently experiencing new cough, shortness of breath, sore throat, runny nose? NO  .  Have you recently travelled outside the state of [place] in the last 14 days? N O .  Have you been in contact with someone that is currently pending confirmation of Covid19 testing or has been confirmed to have the Covid19 virus?  NO  **If the patient answers NO to ALL questions -  advise the patient to please call the clinic before coming to the office should any symptoms develop.     

## 2019-09-29 ENCOUNTER — Telehealth: Payer: Self-pay | Admitting: Pharmacy Technician

## 2019-09-29 ENCOUNTER — Encounter: Payer: Self-pay | Admitting: Infectious Diseases

## 2019-09-29 ENCOUNTER — Ambulatory Visit (INDEPENDENT_AMBULATORY_CARE_PROVIDER_SITE_OTHER): Payer: Self-pay | Admitting: Infectious Diseases

## 2019-09-29 ENCOUNTER — Other Ambulatory Visit: Payer: Self-pay

## 2019-09-29 VITALS — BP 114/73 | HR 68 | Temp 98.0°F | Wt 134.0 lb

## 2019-09-29 DIAGNOSIS — B182 Chronic viral hepatitis C: Secondary | ICD-10-CM

## 2019-09-29 DIAGNOSIS — Z5989 Other problems related to housing and economic circumstances: Secondary | ICD-10-CM

## 2019-09-29 DIAGNOSIS — Z5971 Insufficient health insurance coverage: Secondary | ICD-10-CM

## 2019-09-29 DIAGNOSIS — F1121 Opioid dependence, in remission: Secondary | ICD-10-CM

## 2019-09-29 DIAGNOSIS — Z23 Encounter for immunization: Secondary | ICD-10-CM

## 2019-09-29 DIAGNOSIS — F1990 Other psychoactive substance use, unspecified, uncomplicated: Secondary | ICD-10-CM

## 2019-09-29 DIAGNOSIS — Z598 Other problems related to housing and economic circumstances: Secondary | ICD-10-CM

## 2019-09-29 LAB — POCT URINE PREGNANCY: Preg Test, Ur: NEGATIVE

## 2019-09-29 NOTE — Patient Instructions (Signed)
Nice to meet you today!    We need to get a little more information about your hepatitis c infection before we start your treatment. I anticipate that we can get you started in a few weeks after we submit approval to your insurance to ensure payment. We may need to place referral for an ultrasound and/or gastroenterology if your blood work indicates more damage to the liver than expected.     ABOUT HEPATITIS C VIRUS:   Chronic Hepatitis C is the most common blood-borne infection in the [place], affecting approximately 3 million people.   It is the leading cause of cirrhosis, liver cancer, and end stage liver disease requiring transplantation when this infection goes untreated for many years   The majority of people who are infected are unaware because there are not many early symptoms that are specific to this and often go undiagnosed until a specific blood test is drawn.    The hepatitis c virus is passed primarily through direct exposure of contaminated blood or body fluids. It is most efficiently transmitted through repeated exposure to infected blood.   Risk for sexual transmission is very low but is possible if there is high frequency of unprotected sexual activity with known hepatitis c partner or multiple partners of known status.   Over time, approximately 60-70% of people can develop some degree of liver disease. Cirrhosis occurs in 10-20% of those with chronic infection. 1-5% will get liver cancer, which has a very high rate of death.    Approximately 15-25% clear the infection without medication (usually in the first 6 months of becoming exposed to virus)   Newer medications provide over 95% cure rate when taken as prescribed    IN GENERAL ABOUT DIET  . Persons living with chronic hepatitis c infection should eat a diet to maintain a healthy weight and avoid nutritional deficiencies.   . Completely avoiding alcohol is the best decision for your liver health. If  unable to do so please limit alcohol to as little as possible to less than 1 standard drink a day - this is very irritating to your liver.  . Limit tylenol use to less than 2,000 mg daily (two extra strength tablets only twice a day)  . If you have cirrhosis of the liver please take no more than 1,000 mg tylenol a day  . Patients with cirrhosis should not have protein restriction; we recommend a protein intake of approximately 1.2-1.5 g/kg/day.   . For patients with cirrhosis and hepatic encephalopathy, the American Association for the Study of Liver Diseases (AASLD) recommended protein intake is 1.2-1.5 g/kg/day.  . If you experience ascites (fluid accumulation in the abdomen associated with severe liver damage / cirrhosis) please limit sodium intake to < 2000 mg a day    UNTIL YOU HAVE BEEN TREATED AND CURED:  . Use condoms with all sexual encounters or practice abstinence to avoid sexual transmission   . No sharing of razors, toothbrushes, nail clippers or anything that could potentially have blood on it.   . If you cut yourself please clean and cover any wounds or open sores to others do not come into contact with your blood.   . If blood spills onto item/surface please clean with 1:10 bleach solution and allow to dry, EVEN if it is dried blood.    GENERAL HELPFUL HINTS ON HCV THERAPY:  1. Stay well-hydrated.  2. Notify the ID Clinic of any changes in your other over-the-counter/herbal or prescription medications.    3. If you miss a dose of your medication, take the missed dose as soon as you remember. Return to your regular time/dose schedule the next day.   4.  Do not stop taking your medications without first talking with your healthcare provider.  5.  You will see our pharmacist-specialist within the first 2 weeks of starting your medication to monitor for any possible side effects.  6.  You will have blood work once during treatment 4 weeks after your first pill. Again  soon after treatment is completed and one final lab 3 months after your last pill to ensure cure!   TIPS TO BE SUCCESSFUL WITH DAILY MEDICATION USE:  1. Set a reminder on your phone  2. Try filling out a pill box for the week - pick a day and put one pill for every day during the week so you know right away if you missed a pill.   3. Have a trusted family member ask you about your medications.   4. Smartphone app    Medication we would like to use for you will be : Mavyret   Mavyret Instructions:  1. Take Mavyret, three tablets (at the same time) daily with food. Please take ALL THREE PILLS AT ONCE. You should take it at approximately the same time every day. Treatment will be for 8 weeks. Do not miss a dose.    2. Do not run out of [place]! If you are down to one week of medication left and have not heard about your next shipment, please let us know as soon as possible. You will be given 28 days of treatment at a time and will receive one refill.   3. If you need to start a new medication, prescription from your doctor or over the counter medication, you need to contact us to make sure it does not interfere with [place]. There are several medications that can interfere with Mavyret and can make you sick or make the medication not work.  4. If you need to take a medication for acid reflux, you can take omeprazole 20mg  daily.     5. Tylenol (acetominophen) and Advil (ibuprofen) are safe to take with [place] if needed for headache, fever, pain.   6. IF YOU ARE ON BIRTH CONTROL PILLS, YOU RECEIVE A SHOT FOR BIRTH CONTROL, OR YOU HAVE AN IUD, please notify your provider to make sure it is safe with Mavyret.   7. DO NOT stop Mavyret unless instructed to by your provider. If you are hospitalized while taking this medication please bring it with you to the hospital to avoid interruption of therapy. Every pill is important!  8. The most common side effects associated with Mavyret include:   o Fatigue o Headache o Nausea o Diarrhea o Insomnia     Financial Assistance Applications - [employer] Application and [employer] Application to be completed with the necessary documents and mailed to the addresses highlighted on the paper.   If you lose the forms you can re-print them online at [email or web address] under the 'Financial Assistance Tab'

## 2019-09-29 NOTE — Assessment & Plan Note (Signed)
I provided Xara with [facility] application, [facility] [employer] assistance application and we will use patient assistance to [place] for her. Instructions provided.

## 2019-09-29 NOTE — Addendum Note (Signed)
Addended by: Aundria Rud on: 09/29/2019 11:20 AM   Modules accepted: Orders

## 2019-09-29 NOTE — Telephone Encounter (Signed)
[facility] Patient Advocate Encounter    Findings of the benefits investigation for the patient's upcoming appointment on 09/29/2019 are as follows:   Insurance: no insurance (only [place] Medicaid family planning)  Patient filled out and signed both applications for medication coverage. Once lab results return, we will submit to the company. She is currently living in the listed address with 3 other adults. Two adults in the household are producing income of approximately $3000/month. She wishes to have the medication shipped to the apartment and they will be there to sign for the package. We will call the patient once we receive notification of approval.  Bartholomew Crews, CPhT Specialty Pharmacy Patient [facility] Phone: [phone] Fax: [phone] 09/29/2019 10:05 AM

## 2019-09-29 NOTE — Assessment & Plan Note (Signed)
New Patient with Chronic Hepatitis C (two previous + qualitative RNA levels January and April 2020); genotype unknown, treatment naive. Fibrosis risk unknown, but I would suspect her risk is quite low given she has been injecting < 2 years.   I discussed with the patient the lab findings that confirm chronic hepatitis C as well as the natural history and progression of disease including about 30% of people who develop cirrhosis of the liver if left untreated and once cirrhosis is established there is a 2-7% risk per year of liver cancer and liver failure.  I discussed the importance of treatment and benefits in reducing the risk, even if significant liver fibrosis exists. I also discussed risk for re-infection following treatment should he not continue to modify risk factors.   Will check pregnancy test today - she will be getting her Nexplanon implant replaced for contraception. Counseled to avoid pregnancy while on treatment.    Patient counseled extensively on limiting acetaminophen to no more than 2 grams daily, avoidance of alcohol.  Transmission discussed with patient including sexual transmission, sharing razors and toothbrush.   Will need referral to gastroenterology if concern for cirrhosis  She is currently sober and maintaining this with her own resources. I encouraged her to continue to find strong support system and consider meetings for group support.   Will prescribe appropriate medication based on genotype and coverage   Hepatitis A and B titers to be drawn today with appropriate vaccinations as needed   Pneumovax vaccine to be given today  Further work up to include liver staging through non-invasive serum analysis with APRI and FIB4 scores and Liver Fibrosis panel; U/S to follow if discordant or concerning results.  Will call Ayriana Shi-Trice Dohrmann back once all results are in and counsel on medication over the phone. She will return 4 weeks after starting to meet with  pharmacy team and check RNA at that time.
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
## 2019-09-29 NOTE — Assessment & Plan Note (Signed)
Congratulated on early sobriety and encouraged to reach out should she feel she is struggling with this again.   HIV Ab as well as Hep B sAg to be drawn today as well with h/o injection of illicit drugs within the last 12 months

## 2019-10-03 ENCOUNTER — Telehealth: Payer: Self-pay

## 2019-10-03 NOTE — Telephone Encounter (Signed)
Voicemail:  Patient calling with questions regarding financial assistance for Hep C ?

## 2019-10-04 NOTE — Telephone Encounter (Signed)
[facility] Patient Advocate Encounter  Left voicemail for patient to call in reference to medication process and provided number, [phone] to call us back. Application is currently in process, waiting on one more lab result.

## 2019-10-06 LAB — LIVER FIBROSIS, FIBROTEST-ACTITEST
ALT: 291 U/L — ABNORMAL HIGH (ref 6–29)
Alpha-2-Macroglobulin: 177 mg/dL (ref 106–279)
Apolipoprotein A1: 172 mg/dL (ref 101–198)
Bilirubin: 0.4 mg/dL (ref 0.2–1.2)
Fibrosis Score: 0.08
GGT: 74 U/L — ABNORMAL HIGH (ref 3–40)
Haptoglobin: 97 mg/dL (ref 43–212)
Necroinflammat ACT Score: 0.85
Reference ID: 3175841

## 2019-10-06 LAB — COMPREHENSIVE METABOLIC PANEL
AG Ratio: 1.2 (calc) (ref 1.0–2.5)
ALT: 283 U/L — ABNORMAL HIGH (ref 6–29)
AST: 152 U/L — ABNORMAL HIGH (ref 10–30)
Albumin: 4.1 g/dL (ref 3.6–5.1)
Alkaline phosphatase (APISO): 72 U/L (ref 31–125)
BUN: 12 mg/dL (ref 7–25)
CO2: 25 mmol/L (ref 20–32)
Calcium: 9.2 mg/dL (ref 8.6–10.2)
Chloride: 107 mmol/L (ref 98–110)
Creat: 0.73 mg/dL (ref 0.50–1.10)
Globulin: 3.4 g/dL (calc) (ref 1.9–3.7)
Glucose, Bld: 89 mg/dL (ref 65–99)
Potassium: 4.1 mmol/L (ref 3.5–5.3)
Sodium: 141 mmol/L (ref 135–146)
Total Bilirubin: 0.3 mg/dL (ref 0.2–1.2)
Total Protein: 7.5 g/dL (ref 6.1–8.1)

## 2019-10-06 LAB — CBC
HCT: 42.2 % (ref 35.0–45.0)
Hemoglobin: 13.8 g/dL (ref 11.7–15.5)
MCH: 28.8 pg (ref 27.0–33.0)
MCHC: 32.7 g/dL (ref 32.0–36.0)
MCV: 88.1 fL (ref 80.0–100.0)
MPV: 10.6 fL (ref 7.5–12.5)
Platelets: 421 10*3/uL — ABNORMAL HIGH (ref 140–400)
RBC: 4.79 10*6/uL (ref 3.80–5.10)
RDW: 13.6 % (ref 11.0–15.0)
WBC: 8.8 10*3/uL (ref 3.8–10.8)

## 2019-10-06 LAB — HEPATITIS B SURFACE ANTIBODY, QUANTITATIVE: Hep B S AB Quant (Post): <5 m[IU]/mL — ABNORMAL LOW (ref >=10)

## 2019-10-06 LAB — PROTIME-INR
INR: 1
Prothrombin Time: 10.5 s (ref 9.0–11.5)

## 2019-10-06 LAB — HEPATITIS A ANTIBODY, TOTAL: Hepatitis A AB,Total: REACTIVE — AB

## 2019-10-06 LAB — HIV ANTIBODY (ROUTINE TESTING W REFLEX): HIV 1&2 Ab, 4th Generation: NONREACTIVE

## 2019-10-06 LAB — HEPATITIS C GENOTYPE

## 2019-10-06 LAB — HEPATITIS B SURFACE ANTIGEN: Hepatitis B Surface Ag: NONREACTIVE

## 2019-10-06 LAB — HEPATITIS C RNA QUANTITATIVE
HCV Quantitative Log: 4.96 Log IU/mL — ABNORMAL HIGH
HCV RNA, PCR, QN: 90900 IU/mL — ABNORMAL HIGH

## 2019-10-07 ENCOUNTER — Ambulatory Visit (HOSPITAL_COMMUNITY)
Admission: EM | Admit: 2019-10-07 | Discharge: 2019-10-07 | Disposition: A | Discharge status: Home / Self Care | Payer: Medicaid Other | Attending: Physician Assistant | Admitting: Physician Assistant

## 2019-10-07 ENCOUNTER — Encounter (HOSPITAL_COMMUNITY): Payer: Self-pay

## 2019-10-07 ENCOUNTER — Other Ambulatory Visit: Payer: Self-pay

## 2019-10-07 DIAGNOSIS — L308 Other specified dermatitis: Secondary | ICD-10-CM

## 2019-10-07 DIAGNOSIS — R21 Rash and other nonspecific skin eruption: Secondary | ICD-10-CM

## 2019-10-07 DIAGNOSIS — L309 Dermatitis, unspecified: Secondary | ICD-10-CM

## 2019-10-07 MED ORDER — HYDROCORTISONE 2.5 % EX LOTN: TOPICAL_LOTION | Freq: Two times a day (BID) | CUTANEOUS | 0 refills | Status: DC

## 2019-10-07 MED ORDER — TRIAMCINOLONE ACETONIDE 0.1 % EX CREA: 1.0000 "application " | TOPICAL_CREAM | Freq: Two times a day (BID) | CUTANEOUS | 0 refills | Status: DC

## 2019-10-07 NOTE — Discharge Instructions (Addendum)
Apply cream as directed. Use moisturizer multiple times per day.

## 2019-10-07 NOTE — ED Provider Notes (Signed)
[place]    CSN: [id number] Arrival date & time: 10/07/19  1740      History   Chief Complaint Chief Complaint  Patient presents with  . Rash    HPI Molly Marshall is a 23 y.o. female.   Patient here concerned with "eczema flair up".  She notes she has flair b/l arms and face.  Admits burning around face, itching, denies pain, tenderness, swelling, discharge, erythema.  She is using OTC moisterizer w/o relief.       Past Medical History:  Diagnosis Date  . Abdominal pain   . Asthma   . Diarrhea   . Eczema 08/11/2013  . Eczema   . Headache(784.0)   . Nausea     Patient Active Problem List   Diagnosis Date Noted  . Chronic hepatitis C without hepatic coma ([place]) 09/29/2019  . Injection of illicit drug within last 12 months 09/29/2019  . Underinsured 09/29/2019  . Cannabis dependence ([place]) 01/31/2014  . Opioid use disorder, moderate, in early remission ([place]) 01/31/2014  . ODD (oppositional defiant disorder) 01/31/2014  . MDD (major depressive disorder), recurrent episode, moderate ([place]) 01/28/2014  . Chronic motor or vocal tic disorder 08/22/2013  . PTSD (post-traumatic stress disorder) 08/24/2012    Past Surgical History:  Procedure Laterality Date  . NO PAST SURGERIES      OB History   No obstetric history on file.      Home Medications    Prior to Admission medications   Medication Sig Start Date End Date Taking? Authorizing Provider  hydrocortisone 2.5 % lotion Apply topically 2 (two) times daily. Apply to face 10/07/19   Peri Jefferson, PA-C  ibuprofen (ADVIL,MOTRIN) 800 MG tablet Take 1 tablet (800 mg total) by mouth 3 (three) times daily. 09/18/18   Maczis, Barth Kirks, PA-C  triamcinolone cream (KENALOG) 0.1 % Apply 1 application topically 2 (two) times daily. X 7 days Do not apply to face 10/07/19   Peri Jefferson, PA-C    Family History Family History  Problem Relation Age of Onset  . Mental illness Maternal Uncle    . Drug abuse Maternal Uncle   . Liver cancer Neg Hx   . Liver disease Neg Hx     Social History Social History   Tobacco Use  . Smoking status: Light Tobacco Smoker  . Smokeless tobacco: Never Used  . Tobacco comment: occ smoker  Substance Use Topics  . Alcohol use: Yes  . Drug use: Yes    Types: Solvent inhalants, Marijuana, MDMA (Ecstacy)    Comment: On and off     Allergies   Patient has no known allergies.   Review of Systems Review of Systems  Constitutional: Negative for chills and fever.  Musculoskeletal: Negative for arthralgias, back pain, joint swelling and myalgias.  Skin: Positive for rash. Negative for color change.  Neurological: Negative for weakness.  Hematological: Negative for adenopathy. Does not bruise/bleed easily.  Psychiatric/Behavioral: Negative for agitation and sleep disturbance.  All other systems reviewed and are negative.    Physical Exam Triage Vital Signs ED Triage Vitals [10/07/19 1801]  Enc Vitals Group     BP 112/61     Pulse Rate 82     Resp 16     Temp 98.6 F (37 C)     Temp Source Oral     SpO2 100 %     Weight      Height      Head Circumference  Peak Flow      Pain Score 0     Pain Loc      Pain Edu?      Excl. in GC?    No data found.  Updated Vital Signs BP 112/61 (BP Location: Right Arm)   Pulse 82   Temp 98.6 F (37 C) (Oral)   Resp 16   SpO2 100%   Visual Acuity Right Eye Distance:   Left Eye Distance:   Bilateral Distance:    Right Eye Near:   Left Eye Near:    Bilateral Near:     Physical Exam Vitals signs and nursing note reviewed.  Constitutional:      General: She is not in acute distress.    Appearance: Normal appearance. She is well-developed. She is not ill-appearing.  HENT:     Head: Normocephalic and atraumatic.     Nose: Nose normal.  Eyes:     General: No scleral icterus.    Conjunctiva/sclera: Conjunctivae normal.  Neck:     Musculoskeletal: Neck supple.   Cardiovascular:     Rate and Rhythm: Normal rate and regular rhythm.     Heart sounds: No murmur.  Pulmonary:     Effort: Pulmonary effort is normal. No respiratory distress.     Breath sounds: Normal breath sounds.  Musculoskeletal: Normal range of motion.        General: No swelling or tenderness.  Skin:    General: Skin is warm and dry.     Capillary Refill: Capillary refill takes less than 2 seconds.     Findings: Lesion and rash present. Rash is macular, papular and scaling.       Neurological:     General: No focal deficit present.     Mental Status: She is alert and oriented to person, place, and time.  Psychiatric:        Mood and Affect: Mood normal.        Behavior: Behavior normal.      UC Treatments / Results  Labs (all labs ordered are listed, but only abnormal results are displayed) Labs Reviewed - No data to display  EKG   Radiology No results found.  Procedures Procedures (including critical care time)  Medications Ordered in UC Medications - No data to display  Initial Impression / Assessment and Plan / UC Course  I have reviewed the triage vital signs and the nursing notes.  Pertinent labs & imaging results that were available during my care of the patient were reviewed by me and considered in my medical decision making (see chart for details).     Use cream as directed. Do not use triamcinolone cream on face. Follow up with derm if no improvement. Use emollient several times per day. Final Clinical Impressions(s) / UC Diagnoses   Final diagnoses:  Eczema, unspecified type     Discharge Instructions     Apply cream as directed. Use moisturizer multiple times per day.    ED Prescriptions    Medication Sig Dispense Auth. Provider   triamcinolone cream (KENALOG) 0.1 % Apply 1 application topically 2 (two) times daily. X 7 days Do not apply to face 454 g Evern Core, PA-C   hydrocortisone 2.5 % lotion Apply topically 2 (two)  times daily. Apply to face 59 mL Evern Core, PA-C     PDMP not reviewed this encounter.   Evern Core, PA-C 10/07/19 1903

## 2019-10-07 NOTE — ED Triage Notes (Signed)
Pt present rash, pt states that her eczema has flared up again and she is out of her medication .

## 2019-10-09 ENCOUNTER — Telehealth: Payer: Self-pay | Admitting: Infectious Diseases

## 2019-10-09 NOTE — Telephone Encounter (Signed)
Left non-specific voicemail to patient requesting call back to discuss results of recent tests.    If she calls back:  Her liver function shows some significant inflammation but no permanent damage (F0) - this will likely be completely reversed following treatment of her hepatitis C infection.   Pharmacy team:  She is genotype 1b with VL 90,000. Will treat with 8 weeks of either Harvoni vs Mavyret x 8 weeks depending on patient assistance we collected at initial visit.    Janene Madeira, MSN, NP-C [facility] for Infectious Disease [email or web address] Pager: [phone] Office: [phone] [place]: [phone]

## 2019-10-17 NOTE — Telephone Encounter (Signed)
Application has been faxed to [facility] and patient is aware of the process. She has questions remaining relating to the financial application for [facility]. She provided her work number [phone] as a backup for the company to reach her when shipping out her medication. She lives in an apartment and informed the office they would have to sign for the package.

## 2019-10-23 ENCOUNTER — Other Ambulatory Visit: Payer: Self-pay

## 2019-10-23 ENCOUNTER — Emergency Department (HOSPITAL_COMMUNITY)
Admission: EM | Admit: 2019-10-23 | Discharge: 2019-10-24 | Disposition: A | Discharge status: Home / Self Care | Payer: Medicaid Other | Attending: Emergency Medicine | Admitting: Emergency Medicine

## 2019-10-23 ENCOUNTER — Emergency Department (HOSPITAL_COMMUNITY): Payer: Medicaid Other

## 2019-10-23 ENCOUNTER — Encounter (HOSPITAL_COMMUNITY): Payer: Self-pay

## 2019-10-23 DIAGNOSIS — Y939 Activity, unspecified: Secondary | ICD-10-CM | POA: Insufficient documentation

## 2019-10-23 DIAGNOSIS — S82302A Unspecified fracture of lower end of left tibia, initial encounter for closed fracture: Secondary | ICD-10-CM | POA: Insufficient documentation

## 2019-10-23 DIAGNOSIS — W010XXA Fall on same level from slipping, tripping and stumbling without subsequent striking against object, initial encounter: Secondary | ICD-10-CM | POA: Insufficient documentation

## 2019-10-23 DIAGNOSIS — F17210 Nicotine dependence, cigarettes, uncomplicated: Secondary | ICD-10-CM | POA: Insufficient documentation

## 2019-10-23 DIAGNOSIS — F1210 Cannabis abuse, uncomplicated: Secondary | ICD-10-CM | POA: Insufficient documentation

## 2019-10-23 DIAGNOSIS — F1990 Other psychoactive substance use, unspecified, uncomplicated: Secondary | ICD-10-CM | POA: Insufficient documentation

## 2019-10-23 DIAGNOSIS — Y999 Unspecified external cause status: Secondary | ICD-10-CM | POA: Insufficient documentation

## 2019-10-23 DIAGNOSIS — Y929 Unspecified place or not applicable: Secondary | ICD-10-CM | POA: Insufficient documentation

## 2019-10-23 DIAGNOSIS — J45909 Unspecified asthma, uncomplicated: Secondary | ICD-10-CM | POA: Insufficient documentation
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
## 2019-10-24 NOTE — ED Notes (Signed)
Ortho at bedside.

## 2019-10-24 NOTE — Discharge Instructions (Signed)
Make sure to take Tylenol and ibuprofen as needed for pain.  Keep your foot elevated and ice it.  You will need to be nonweightbearing until you follow-up with orthopedics.  I will call them to schedule an appointment when you leave here today.  Return if you develop new or worsening symptoms such as severe pain, color changes to her extremities, numbness to your foot.

## 2019-10-24 NOTE — Progress Notes (Signed)
Orthopedic Tech Progress Note Patient Details:  Daveah Varone [facility] November 15, 1995 [id number]  Ortho Devices Type of Ortho Device: Ankle Air splint, Crutches Ortho Device/Splint Location: LLE Ortho Device/Splint Interventions: Application, Ordered   Post Interventions Patient Tolerated: Ambulated well, Well Instructions Provided: Poper ambulation with device, Care of device, Adjustment of device   Janit Pagan 10/24/2019, 10:35 AM

## 2019-10-24 NOTE — ED Notes (Signed)
Pt d/c home per MD order. Discharge summary reviewed with pt,pt verbalizes understanding. Reports discharge ride home.  

## 2019-10-27 NOTE — Telephone Encounter (Signed)
Received notification via fax from [facility] that the they received the patient's application and currently completing their review of the application. It states that they will contact the patient directly to schedule delivery and may not contact the office. Provided number [phone] for information. I will follow-up next week to see if the application is approved.

## 2019-11-01 ENCOUNTER — Encounter (HOSPITAL_BASED_OUTPATIENT_CLINIC_OR_DEPARTMENT_OTHER): Payer: Self-pay | Admitting: Orthopedic Surgery

## 2019-11-01 ENCOUNTER — Other Ambulatory Visit: Payer: Self-pay

## 2019-11-01 NOTE — Progress Notes (Addendum)
ADDENDUM:  Chart reviewed anesthesia, Molly Felix PA, ok to proceed if urine drug screen ok.  Spoke w/ via phone for pre-op interview---  PT Lab needs dos--- CBC, CBP, Urine preg,  And Urine drug screen          Lab results------ no COVID test ------ 11-06-2019 @ 1400 Arrive at -------  0945 NPO after ------  MN Medications to take morning of surgery -----  NONE Diabetic medication ----- n/a Patient Special Instructions ----- n/a Pre-Op special Istructions ----- Patient verbalized understanding of instructions that were given at this phone interview. Patient denies shortness of breath, chest pain, fever, cough a this phone interview.   Anesthesia Review: hx Hep C, Hep B, new dx ;  Polysubstance abuse, pt states last used 2018 however in epic documented ED visit 05/ 2020 overdose snorting oxycodone  PCP: Dr Molly Marshall Infectious Disease:  Molly Marshall (lov 09-29-2019 epic) Cardiologist : no  Chest x-ray : 03-10-2019 epic EKG : 03-10-2019 epic Echo : no  Sleep Study/ CPAP : NO Fasting Blood Sugar :      n/a

## 2019-11-06 ENCOUNTER — Other Ambulatory Visit (HOSPITAL_COMMUNITY)
Admission: RE | Admit: 2019-11-06 | Discharge: 2019-11-06 | Disposition: A | Discharge status: Home / Self Care | Payer: Medicaid Other | Source: Ambulatory Visit | Attending: Orthopedic Surgery | Admitting: Orthopedic Surgery

## 2019-11-06 DIAGNOSIS — Z01812 Encounter for preprocedural laboratory examination: Secondary | ICD-10-CM | POA: Insufficient documentation

## 2019-11-06 DIAGNOSIS — Z20828 Contact with and (suspected) exposure to other viral communicable diseases: Secondary | ICD-10-CM | POA: Insufficient documentation

## 2019-11-07 LAB — NOVEL CORONAVIRUS, NAA (HOSP ORDER, SEND-OUT TO REF LAB; TAT 18-24 HRS): SARS-CoV-2, NAA: NOT DETECTED

## 2019-11-08 NOTE — Progress Notes (Addendum)
Anesthesia Chart Review   Case: [id number] Date/Time: 11/09/19 1041   Procedure: OPEN REDUCTION INTERNAL FIXATION (ORIF) ANKLE SYNDESMOSIS FRACTURE (Left Ankle) - 90 mins   Anesthesia type: Choice   Pre-op diagnosis: Left ankle distal tibio fibular disruption   Location: [place] OR ROOM 1 / [place]   Surgeons: Nicholes Stairs, MD      DISCUSSION:23 y.o. light tobacco smoker, opoid use disorder in early remission per PCP note, Hepatitis C, left ankle fracture scheduled for above procedure 11/09/2019 with Dr. Victorino December.   Recent diagnosis of Hepatitis C, pending treatment.    Discussed with Dr. Nyoka Cowden.  Will obtain labs DOS.  Anticipate pt can proceed with planned procedure barring acute status change.   VS: Ht 5\' 3"  (1.6 m)   Wt 61.2 kg   BMI 23.91 kg/m   PROVIDERS: Danella Penton, MD is PCP    LABS: labs DOS (all labs ordered are listed, but only abnormal results are displayed)  Labs Reviewed - No data to display   IMAGES:   EKG: 03/10/2019 Rate 91 bpm  Sinus rhythm Borderline T abnormalities, diffuse leads Prolonged QT interval No old tracing to compare  CV:  Past Medical History:  Diagnosis Date  . Cannabis dependence ([place])    documented in epic ED visit 03-10-2019 positive urine drug screen  . Chronic hepatitis C ([place])    followed by infectious disease-- Janene Madeira NP  -- (11-01-2019 per pt was dx 02/ 2020)   . Eczema   . GAD (generalized anxiety disorder)   . Headache(784.0)   . Hepatitis B antibody positive    lab in epic 09-29-2019    . Injection of illicit drug within last 12 months    11-01-2019  per infectious disease noted dated 09-29-2019,  per pt last used 2018  . MDD (major depressive disorder)   . Opioid use disorder ([place])    11-01-2019 per pt last used 2018,  however documented in epic ED visit 03-10-2019 oxycodone overdose  . Oppositional defiant disorder   . PTSD (post-traumatic stress disorder)     Past  Surgical History:  Procedure Laterality Date  . NO PAST SURGERIES      MEDICATIONS: No current facility-administered medications for this encounter.   . etonogestrel (NEXPLANON) 68 MG IMPL implant  . hydrocortisone 2.5 % lotion  . ibuprofen (ADVIL) 200 MG tablet  . triamcinolone cream (KENALOG) 0.1 %     Maia Plan [facility] Pre-Surgical Testing [phone] 11/08/19  2:00 PM

## 2019-11-09 ENCOUNTER — Ambulatory Visit (HOSPITAL_BASED_OUTPATIENT_CLINIC_OR_DEPARTMENT_OTHER)
Admission: RE | Admit: 2019-11-09 | Discharge: 2019-11-09 | Disposition: A | Discharge status: Home / Self Care | Payer: Self-pay | Attending: Orthopedic Surgery | Admitting: Orthopedic Surgery

## 2019-11-09 ENCOUNTER — Ambulatory Visit (HOSPITAL_COMMUNITY): Payer: Self-pay

## 2019-11-09 ENCOUNTER — Ambulatory Visit (HOSPITAL_BASED_OUTPATIENT_CLINIC_OR_DEPARTMENT_OTHER): Payer: Self-pay | Admitting: Physician Assistant

## 2019-11-09 ENCOUNTER — Encounter (HOSPITAL_BASED_OUTPATIENT_CLINIC_OR_DEPARTMENT_OTHER): Payer: Self-pay | Admitting: Orthopedic Surgery

## 2019-11-09 ENCOUNTER — Encounter (HOSPITAL_BASED_OUTPATIENT_CLINIC_OR_DEPARTMENT_OTHER)
Admission: RE | Disposition: A | Discharge status: Home / Self Care | Payer: Self-pay | Source: Home / Self Care | Attending: Orthopedic Surgery

## 2019-11-09 DIAGNOSIS — F17210 Nicotine dependence, cigarettes, uncomplicated: Secondary | ICD-10-CM | POA: Insufficient documentation

## 2019-11-09 DIAGNOSIS — X58XXXA Exposure to other specified factors, initial encounter: Secondary | ICD-10-CM | POA: Insufficient documentation

## 2019-11-09 DIAGNOSIS — F418 Other specified anxiety disorders: Secondary | ICD-10-CM | POA: Insufficient documentation

## 2019-11-09 DIAGNOSIS — B182 Chronic viral hepatitis C: Secondary | ICD-10-CM | POA: Insufficient documentation

## 2019-11-09 DIAGNOSIS — Z419 Encounter for procedure for purposes other than remedying health state, unspecified: Secondary | ICD-10-CM

## 2019-11-09 DIAGNOSIS — S93432A Sprain of tibiofibular ligament of left ankle, initial encounter: Secondary | ICD-10-CM | POA: Insufficient documentation

## 2019-11-09 HISTORY — DX: Post-traumatic stress disorder, unspecified: F43.10

## 2019-11-09 HISTORY — DX: Opioid use, unspecified, uncomplicated: F11.90

## 2019-11-09 HISTORY — DX: Other psychoactive substance use, unspecified, uncomplicated: F19.90

## 2019-11-09 HISTORY — DX: Other specified abnormal immunological findings in serum: R76.8

## 2019-11-09 HISTORY — DX: Chronic viral hepatitis C: B18.2

## 2019-11-09 HISTORY — PX: ORIF ANKLE FRACTURE: SHX5408

## 2019-11-09 HISTORY — DX: Oppositional defiant disorder: F91.3

## 2019-11-09 HISTORY — DX: Generalized anxiety disorder: F41.1

## 2019-11-09 HISTORY — DX: Major depressive disorder, single episode, unspecified: F32.9

## 2019-11-09 HISTORY — DX: Cannabis dependence, uncomplicated: F12.20

## 2019-11-09 LAB — COMPREHENSIVE METABOLIC PANEL
ALT: 232 U/L — ABNORMAL HIGH (ref 0–44)
AST: 128 U/L — ABNORMAL HIGH (ref 15–41)
Albumin: 3.9 g/dL (ref 3.5–5.0)
Alkaline Phosphatase: 70 U/L (ref 38–126)
Anion gap: 10 (ref 5–15)
BUN: 15 mg/dL (ref 6–20)
CO2: 23 mmol/L (ref 22–32)
Calcium: 9.3 mg/dL (ref 8.9–10.3)
Chloride: 107 mmol/L (ref 98–111)
Creatinine, Ser: 0.7 mg/dL (ref 0.44–1.00)
GFR calc Af Amer: >60 mL/min (ref >60)
GFR calc non Af Amer: >60 mL/min (ref >60)
Glucose, Bld: 98 mg/dL (ref 70–99)
Potassium: 3.9 mmol/L (ref 3.5–5.1)
Sodium: 140 mmol/L (ref 135–145)
Total Bilirubin: 0.4 mg/dL (ref 0.3–1.2)
Total Protein: 7.8 g/dL (ref 6.5–8.1)

## 2019-11-09 LAB — CBC
HCT: 42.9 % (ref 36.0–46.0)
Hemoglobin: 13.9 g/dL (ref 12.0–15.0)
MCH: 29.4 pg (ref 26.0–34.0)
MCHC: 32.4 g/dL (ref 30.0–36.0)
MCV: 90.7 fL (ref 80.0–100.0)
Platelets: 371 10*3/uL (ref 150–400)
RBC: 4.73 MIL/uL (ref 3.87–5.11)
RDW: 14.6 % (ref 11.5–15.5)
WBC: 7.9 10*3/uL (ref 4.0–10.5)
nRBC: 0 % (ref 0.0–0.2)

## 2019-11-09 LAB — RAPID URINE DRUG SCREEN, HOSP PERFORMED
Amphetamines: NOT DETECTED
Barbiturates: NOT DETECTED
Benzodiazepines: POSITIVE — AB
Cocaine: NOT DETECTED
Opiates: NOT DETECTED
Tetrahydrocannabinol: POSITIVE — AB

## 2019-11-09 LAB — POCT PREGNANCY, URINE: Preg Test, Ur: NEGATIVE

## 2019-11-09 SURGERY — OPEN REDUCTION INTERNAL FIXATION (ORIF) ANKLE FRACTURE
Anesthesia: General | Site: Ankle | Laterality: Left

## 2019-11-09 MED ORDER — FENTANYL CITRATE (PF) 100 MCG/2ML IJ SOLN
INTRAMUSCULAR | Status: AC
  Filled 2019-11-09: qty 2

## 2019-11-09 MED ORDER — PROPOFOL 10 MG/ML IV BOLUS
INTRAVENOUS | Status: AC
  Filled 2019-11-09: qty 20

## 2019-11-09 MED ORDER — LIDOCAINE 2% (20 MG/ML) 5 ML SYRINGE
INTRAMUSCULAR | Status: AC
  Filled 2019-11-09: qty 5

## 2019-11-09 MED ORDER — OXYCODONE HCL 5 MG PO TABS: 5.0000 mg | ORAL_TABLET | ORAL | 0 refills | Status: AC | PRN

## 2019-11-09 MED ORDER — OXYCODONE HCL 5 MG PO TABS
5.0000 mg | ORAL_TABLET | Freq: Once | ORAL | Status: DC | PRN
  Filled 2019-11-09: qty 1

## 2019-11-09 MED ORDER — ONDANSETRON 4 MG PO TBDP: 4.0000 mg | ORAL_TABLET | Freq: Three times a day (TID) | ORAL | 0 refills | Status: DC | PRN

## 2019-11-09 MED ORDER — LIDOCAINE 2% (20 MG/ML) 5 ML SYRINGE
INTRAMUSCULAR | Status: DC | PRN
  Administered 2019-11-09: 100 mg via INTRAVENOUS

## 2019-11-09 MED ORDER — LACTATED RINGERS IV SOLN
INTRAVENOUS | Status: DC
  Filled 2019-11-09: qty 1000

## 2019-11-09 MED ORDER — ROPIVACAINE HCL 5 MG/ML IJ SOLN
INTRAMUSCULAR | Status: DC | PRN
  Administered 2019-11-09 (×6): 5 mL via PERINEURAL

## 2019-11-09 MED ORDER — ROPIVACAINE HCL 7.5 MG/ML IJ SOLN
INTRAMUSCULAR | Status: DC | PRN
  Administered 2019-11-09 (×4): 5 mL via PERINEURAL

## 2019-11-09 MED ORDER — SOD CITRATE-CITRIC ACID 500-334 MG/5ML PO SOLN
ORAL | Status: AC
  Filled 2019-11-09: qty 15

## 2019-11-09 MED ORDER — DEXAMETHASONE SODIUM PHOSPHATE 10 MG/ML IJ SOLN
INTRAMUSCULAR | Status: AC
  Filled 2019-11-09: qty 1

## 2019-11-09 MED ORDER — OXYCODONE HCL 5 MG/5ML PO SOLN
5.0000 mg | Freq: Once | ORAL | Status: DC | PRN
  Filled 2019-11-09: qty 5

## 2019-11-09 MED ORDER — MIDAZOLAM HCL 2 MG/2ML IJ SOLN
INTRAMUSCULAR | Status: AC
  Filled 2019-11-09: qty 2

## 2019-11-09 MED ORDER — SUCCINYLCHOLINE CHLORIDE 200 MG/10ML IV SOSY
PREFILLED_SYRINGE | INTRAVENOUS | Status: DC | PRN
  Administered 2019-11-09: 160 mg via INTRAVENOUS

## 2019-11-09 MED ORDER — KETOROLAC TROMETHAMINE 30 MG/ML IJ SOLN
30.0000 mg | Freq: Once | INTRAMUSCULAR | Status: DC | PRN
  Filled 2019-11-09: qty 1

## 2019-11-09 MED ORDER — DEXAMETHASONE SODIUM PHOSPHATE 10 MG/ML IJ SOLN
INTRAMUSCULAR | Status: DC | PRN
  Administered 2019-11-09: 10 mg via INTRAVENOUS

## 2019-11-09 MED ORDER — FENTANYL CITRATE (PF) 100 MCG/2ML IJ SOLN
100.0000 ug | Freq: Once | INTRAMUSCULAR | Status: AC
  Administered 2019-11-09: 10:00:00 100 ug via INTRAVENOUS
  Filled 2019-11-09: qty 2

## 2019-11-09 MED ORDER — ONDANSETRON HCL 4 MG/2ML IJ SOLN
INTRAMUSCULAR | Status: AC
  Filled 2019-11-09: qty 2

## 2019-11-09 MED ORDER — CEFAZOLIN SODIUM-DEXTROSE 2-4 GM/100ML-% IV SOLN
2.0000 g | INTRAVENOUS | Status: AC
  Administered 2019-11-09: 2 g via INTRAVENOUS
  Filled 2019-11-09: qty 100

## 2019-11-09 MED ORDER — SOD CITRATE-CITRIC ACID 500-334 MG/5ML PO SOLN
ORAL | Status: DC | PRN
  Administered 2019-11-09: 30 mL via ORAL

## 2019-11-09 MED ORDER — ACETAMINOPHEN 325 MG PO TABS
325.0000 mg | ORAL_TABLET | ORAL | Status: DC | PRN
  Filled 2019-11-09: qty 2

## 2019-11-09 MED ORDER — CEFAZOLIN SODIUM-DEXTROSE 2-4 GM/100ML-% IV SOLN
INTRAVENOUS | Status: AC
  Filled 2019-11-09: qty 100

## 2019-11-09 MED ORDER — CHLORHEXIDINE GLUCONATE 4 % EX LIQD
60.0000 mL | Freq: Once | CUTANEOUS | Status: DC
  Filled 2019-11-09: qty 118

## 2019-11-09 MED ORDER — MIDAZOLAM HCL 2 MG/2ML IJ SOLN
2.0000 mg | Freq: Once | INTRAMUSCULAR | Status: AC
  Administered 2019-11-09: 2 mg via INTRAVENOUS
  Filled 2019-11-09: qty 2

## 2019-11-09 MED ORDER — METOCLOPRAMIDE HCL 5 MG/ML IJ SOLN
INTRAMUSCULAR | Status: DC | PRN
  Administered 2019-11-09: 10 mg via INTRAVENOUS

## 2019-11-09 MED ORDER — FENTANYL CITRATE (PF) 100 MCG/2ML IJ SOLN
25.0000 ug | INTRAMUSCULAR | Status: DC | PRN
  Filled 2019-11-09: qty 1

## 2019-11-09 MED ORDER — METOCLOPRAMIDE HCL 5 MG/ML IJ SOLN
INTRAMUSCULAR | Status: AC
  Filled 2019-11-09: qty 2

## 2019-11-09 MED ORDER — MEPERIDINE HCL 25 MG/ML IJ SOLN
6.2500 mg | INTRAMUSCULAR | Status: DC | PRN
  Filled 2019-11-09: qty 1

## 2019-11-09 MED ORDER — PROPOFOL 10 MG/ML IV BOLUS
INTRAVENOUS | Status: DC | PRN
  Administered 2019-11-09: 200 mg via INTRAVENOUS

## 2019-11-09 MED ORDER — PROMETHAZINE HCL 25 MG/ML IJ SOLN
6.2500 mg | INTRAMUSCULAR | Status: DC | PRN
  Filled 2019-11-09: qty 1

## 2019-11-09 MED ORDER — ACETAMINOPHEN 160 MG/5ML PO SOLN
325.0000 mg | ORAL | Status: DC | PRN
  Filled 2019-11-09: qty 20.3

## 2019-11-09 MED ORDER — ONDANSETRON HCL 4 MG/2ML IJ SOLN
INTRAMUSCULAR | Status: DC | PRN
  Administered 2019-11-09: 4 mg via INTRAVENOUS

## 2019-11-09 SURGICAL SUPPLY — 60 items
ALCOHOL 70% 16 OZ (MISCELLANEOUS) ×2 IMPLANT
BANDAGE ACE 4X5 VEL STRL LF (GAUZE/BANDAGES/DRESSINGS) IMPLANT
BANDAGE ACE 6X5 VEL STRL LF (GAUZE/BANDAGES/DRESSINGS) IMPLANT
BANDAGE ESMARK 6X9 LF (GAUZE/BANDAGES/DRESSINGS) IMPLANT
BNDG COHESIVE 4X5 TAN STRL (GAUZE/BANDAGES/DRESSINGS) ×2 IMPLANT
BNDG ELASTIC 4X5.8 VLCR STR LF (GAUZE/BANDAGES/DRESSINGS) ×2 IMPLANT
BNDG ELASTIC 6X5.8 VLCR STR LF (GAUZE/BANDAGES/DRESSINGS) ×2 IMPLANT
BNDG ESMARK 6X9 LF (GAUZE/BANDAGES/DRESSINGS)
CANISTER SUCT 3000ML PPV (MISCELLANEOUS) ×2 IMPLANT
COVER SURGICAL LIGHT HANDLE (MISCELLANEOUS) ×2 IMPLANT
COVER WAND RF STERILE (DRAPES) ×2 IMPLANT
CUFF TOURN SGL QUICK 34 (TOURNIQUET CUFF) ×1
CUFF TRNQT CYL 34X4.125X (TOURNIQUET CUFF) ×1 IMPLANT
DRAPE C-ARM 42X120 X-RAY (DRAPES) ×2 IMPLANT
DRAPE C-ARMOR (DRAPES) ×2 IMPLANT
DRAPE INCISE IOBAN 66X45 STRL (DRAPES) IMPLANT
DRAPE SHEET LG 3/4 BI-LAMINATE (DRAPES) ×2 IMPLANT
DRAPE U-SHAPE 47X51 STRL (DRAPES) ×2 IMPLANT
DRSG ADAPTIC 3X8 NADH LF (GAUZE/BANDAGES/DRESSINGS) ×2 IMPLANT
DRSG EMULSION OIL 3X3 NADH (GAUZE/BANDAGES/DRESSINGS) ×2 IMPLANT
DRSG PAD ABDOMINAL 8X10 ST (GAUZE/BANDAGES/DRESSINGS) ×2 IMPLANT
DURAPREP 26ML APPLICATOR (WOUND CARE) ×2 IMPLANT
ELECT REM PT RETURN 9FT ADLT (ELECTROSURGICAL) ×2
ELECTRODE REM PT RTRN 9FT ADLT (ELECTROSURGICAL) ×1 IMPLANT
GAUZE SPONGE 4X4 12PLY STRL (GAUZE/BANDAGES/DRESSINGS) ×2 IMPLANT
GLOVE BIO SURGEON STRL SZ7.5 (GLOVE) ×2 IMPLANT
GLOVE BIOGEL PI IND STRL 8 (GLOVE) ×1 IMPLANT
GLOVE BIOGEL PI INDICATOR 8 (GLOVE) ×1
GOWN STRL REUS W/ TWL XL LVL3 (GOWN DISPOSABLE) ×1 IMPLANT
GOWN STRL REUS W/TWL XL LVL3 (GOWN DISPOSABLE) ×1
KIT TURNOVER CYSTO (KITS) ×2 IMPLANT
MANIFOLD NEPTUNE II (INSTRUMENTS) IMPLANT
NEEDLE HYPO 25X1 1.5 SAFETY (NEEDLE) IMPLANT
NS IRRIG 1000ML POUR BTL (IV SOLUTION) ×2 IMPLANT
PACK ORTHO EXTREMITY (CUSTOM PROCEDURE TRAY) ×2 IMPLANT
PAD ABD 8X10 STRL (GAUZE/BANDAGES/DRESSINGS) ×2 IMPLANT
PAD ARMBOARD 7.5X6 YLW CONV (MISCELLANEOUS) ×4 IMPLANT
PAD CAST 4YDX4 CTTN HI CHSV (CAST SUPPLIES) ×1 IMPLANT
PADDING CAST COTTON 4X4 STRL (CAST SUPPLIES) ×1
PADDING CAST COTTON 6X4 STRL (CAST SUPPLIES) ×2 IMPLANT
PLATE SYNDESMOSIS 2H 1.5 (Plate) ×1 IMPLANT
PLATE SYNDESMOSIS 2H 1.5 STRL (Plate) ×1 IMPLANT
SET PAD KNEE POSITIONER (MISCELLANEOUS) ×2 IMPLANT
SPONGE LAP 18X18 X RAY DECT (DISPOSABLE) IMPLANT
STOCKINETTE 6  STRL (DRAPES) ×1
STOCKINETTE 6 STRL (DRAPES) ×1 IMPLANT
STRIP CLOSURE SKIN 1/2X4 (GAUZE/BANDAGES/DRESSINGS) IMPLANT
SUCTION FRAZIER HANDLE 10FR (MISCELLANEOUS) ×1
SUCTION TUBE FRAZIER 10FR DISP (MISCELLANEOUS) ×1 IMPLANT
SUT ETHILON 3 0 PS 1 (SUTURE) ×2 IMPLANT
SUT VIC AB 0 CT1 27 (SUTURE)
SUT VIC AB 0 CT1 27XBRD ANBCTR (SUTURE) IMPLANT
SUT VIC AB 0 CT1 36 (SUTURE) ×2 IMPLANT
SUT VIC AB 2-0 CT1 27 (SUTURE) ×1
SUT VIC AB 2-0 CT1 TAPERPNT 27 (SUTURE) ×1 IMPLANT
SUT VIC AB 2-0 CT2 27 (SUTURE) IMPLANT
SYR CONTROL 10ML LL (SYRINGE) IMPLANT
TOWEL OR 17X26 10 PK STRL BLUE (TOWEL DISPOSABLE) ×4 IMPLANT
TUBE CONNECTING 12X1/4 (SUCTIONS) ×2 IMPLANT
YANKAUER SUCT BULB TIP NO VENT (SUCTIONS) ×2 IMPLANT

## 2019-11-09 NOTE — Anesthesia Procedure Notes (Signed)
Anesthesia Regional Block: Adductor canal block   Pre-Anesthetic Checklist: ,, timeout performed, Correct Patient, Correct Site, Correct Laterality, Correct Procedure, Correct Position, site marked, Risks and benefits discussed,  Surgical consent,  Pre-op evaluation,  At surgeon's request and post-op pain management  Laterality: Lower and Left  Prep: chloraprep       Needles:  Injection technique: Single-shot  Needle Type: Echogenic Stimulator Needle     Needle Length: 10cm  Needle Gauge: 21   Needle insertion depth: 2 cm   Additional Needles:   Procedures:,,,, ultrasound used (permanent image in chart),,,,  Narrative:  Start time: 11/09/2019 10:30 AM End time: 11/09/2019 10:40 AM Injection made incrementally with aspirations every 5 mL.  Performed by: Personally  Anesthesiologist: Lyn Hollingshead, MD

## 2019-11-09 NOTE — Discharge Instructions (Signed)
-No weightbearing to left lower extremity for 6 weeks.  Use crutches.  -Please elevate left lower extremity with "toes above nose" as able throughout the day.  -Maintain postoperative bandage clean and dry until your follow-up appointment.  Please do not get this wet.  -Maintain postoperative fracture boot at all times.  This is maintaining your ankle in the appropriate position.  -For mild to moderate pain use Tylenol and Advil around-the-clock every 6 hours respectively.  For breakthrough pain use oxycodone as needed.  -For the prevention of blood clots take an aspirin, 81 mg, once per day for 6 weeks.  -Return to see Dr. Aundria Rud in 2 weeks for routine postop care.   Post Anesthesia Home Care Instructions  Activity: Get plenty of rest for the remainder of the day. A responsible individual must stay with you for 24 hours following the procedure.  For the next 24 hours, DO NOT: -Drive a car -Advertising copywriter -Drink alcoholic beverages -Take any medication unless instructed by your physician -Make any legal decisions or sign important papers.  Meals: Start with liquid foods such as gelatin or soup. Progress to regular foods as tolerated. Avoid greasy, spicy, heavy foods. If nausea and/or vomiting occur, drink only clear liquids until the nausea and/or vomiting subsides. Call your physician if vomiting continues.  Special Instructions/Symptoms: Your throat may feel dry or sore from the anesthesia or the breathing tube placed in your throat during surgery. If this causes discomfort, gargle with warm salt water. The discomfort should disappear within 24 hours.    Regional Anesthesia Blocks  1. Numbness or the inability to move the "blocked" extremity may last from 3-48 hours after placement. The length of time depends on the medication injected and your individual response to the medication. If the numbness is not going away after 48 hours, call your surgeon.  2. The extremity that is  blocked will need to be protected until the numbness is gone and the  Strength has returned. Because you cannot feel it, you will need to take extra care to avoid injury. Because it may be weak, you may have difficulty moving it or using it. You may not know what position it is in without looking at it while the block is in effect.  3. For blocks in the legs and feet, returning to weight bearing and walking needs to be done carefully. You will need to wait until the numbness is entirely gone and the strength has returned. You should be able to move your leg and foot normally before you try and bear weight or walk. You will need someone to be with you when you first try to ensure you do not fall and possibly risk injury.  4. Bruising and tenderness at the needle site are common side effects and will resolve in a few days.  5. Persistent numbness or new problems with movement should be communicated to the surgeon or the [facility] [phone] [facility] [phone]).  Crutch Use, Adult Crutches are used to take weight off of one of your legs or feet when you stand or walk. You may need crutches to help you heal after an injury or procedure. It is important to use crutches that fit right. Your crutches fit right if:  You can fit two or three fingers between your armpit and the crutch.  You use your hands, not your armpits, to hold yourself up. It is important that a doctor has seen you use crutches the  right way before you use them at home. What are the risks? Using crutches the wrong way can hurt your shoulders, arms, back, armpits, wrists, and hands. To avoid this:  Make sure your crutches fit right.  Do not put pressure on your armpits when using the crutches. While using crutches, you also have a higher risk of falling. To avoid this:  Move objects away from the floor or area where you walk when possible. Get help as needed.  Keep walkways well lit.  Use a  backpack to carry items. How to use your crutches How you use your crutches will depend on why you need them. Your doctor may tell you not to support your body weight (bear weight) on your hurt leg. Or your doctor may let you put some, but not all, of your weight on the hurt leg. Follow instructions from your doctor about putting weight on your leg. Do not put weight on your leg in an amount that causes pain. Walking  1. Stand on your good leg and lift both crutches at the same time. 2. Place the crutches one step-length in front of you. Keep your weight over the hand grips. 3. Bring the good leg forward to meet the crutches or to land a little bit ahead of them. 4. Repeat. Going up steps  If there is no handrail: 1. Walk up to steps and put weight on hand grips to step up. 2. Step up with your good leg. 3. Step up with the crutches and your hurt leg. 4. Repeat. If there is a handrail: 1. Hold both crutches in one hand. 2. Place your other hand on the handrail. 3. Put your weight on your arms and lift your good leg up to the step. 4. Bring the crutches and the hurt leg up to that step. 5. Repeat. If you do not feel steady on steps, you can go up steps on your butt. 1. Sit on the lowest step. ? Have your hurt leg out in front. ? Hold both crutches flat on the stairs in one hand. 2. Scoot your butt up to the next step. Use your free hand and your good leg to help you do this.  Going down steps If there is no handrail: 1. Step down with your hurt leg and crutches. Keep the crutch tips in the center of the step. Do not put crutch tips close to the edge of the step. 2. Step down with your good leg. 3. Repeat. If there is a handrail: 1. Place one hand on the handrail. 2. Hold both crutches with your free hand. 3. Lower your hurt leg and crutches to the step below you. Keep the crutch tips in the center of the step. Do not put the crutch tips close to the edge of the step. 4. Lower your  good leg to the next step. 5. Repeat. If you do not feel steady on steps, you can go down steps on your butt. 1. Sit on the highest step. ? Have your hurt leg out in front. ? Use your other hand to hold both crutches flat on the stairs. 2. Scoot your butt down to the next step. Use the free hand and your good leg to help you do this.  Standing up  Move to the edge of the seat. If there is an armrest: 1. Hold the hurt leg forward. 2. Grab the armrest with one hand. Use the other hand to grab the top of the crutches.  3. Use the armrest and your crutches to pull yourself up to stand. If there is no armrest: 1. Hold the hurt leg forward. 2. Hold on to the seat with one hand. Use the other hand to hold the top of the crutches. 3. Use the seat and your crutches to bring yourself up to stand. Sitting down  Move back until your leg touches the edge of the seat. If there is an armrest: 1. Hold the hurt leg forward. 2. Grab the armrest with one hand. Use the other hand to grab the top of the crutches. 3. Slowly lower yourself to sit. If there is no armrest: 1. Hold the hurt leg forward. 2. Reach for and hold on to the seat with one hand. Use the other hand to hold on to the top of the crutches. 3. Slowly lower yourself to sit. Get help if:  You feel unsteady using crutches.  You have any new pain.  You lose feeling (feel numb) or you have a tingling feeling.  Your crutches do not fit. Get help right away if:  You fall. Summary  Crutches are used to take weight off of a leg or foot when you stand or walk.  Make sure your crutches fit right, and do not put pressure on your armpits when using the crutches.  Follow instructions from your doctor about putting weight on your hurt leg. This information is not intended to replace advice given to you by your health care provider. Make sure you discuss any questions you have with your health care provider. Document Revised: 05/17/2019  Document Reviewed: 05/17/2019 Elsevier Patient Education  [place].

## 2019-11-09 NOTE — Anesthesia Procedure Notes (Signed)
Procedure Name: Intubation Date/Time: 11/09/2019 11:18 AM Performed by: Suan Halter, CRNA Pre-anesthesia Checklist: Patient identified, Emergency Drugs available, Suction available and Patient being monitored Patient Re-evaluated:Patient Re-evaluated prior to induction Oxygen Delivery Method: Circle system utilized Preoxygenation: Pre-oxygenation with 100% oxygen Induction Type: IV induction, Rapid sequence and Cricoid Pressure applied Laryngoscope Size: Mac and 3 Grade View: Grade I Tube type: Oral Tube size: 7.0 mm Number of attempts: 1 Airway Equipment and Method: Stylet and Oral airway Placement Confirmation: ETT inserted through vocal cords under direct vision,  positive ETCO2 and breath sounds checked- equal and bilateral Secured at: 22 cm Tube secured with: Tape Dental Injury: Teeth and Oropharynx as per pre-operative assessment

## 2019-11-09 NOTE — Transfer of Care (Signed)
Immediate Anesthesia Transfer of Care Note  Patient: Molly Marshall  Procedure(s) Performed: Procedure(s) (LRB): OPEN REDUCTION INTERNAL FIXATION (ORIF) ANKLE SYNDESMOSIS FRACTURE (Left)  Patient Location: PACU  Anesthesia Type: General  Level of Consciousness: awake, oriented, sedated and patient cooperative  Airway & Oxygen Therapy: Patient Spontanous Breathing and Patient connected to face mask oxygen  Post-op Assessment: Report given to PACU RN and Post -op Vital signs reviewed and stable  Post vital signs: Reviewed and stable  Complications: No apparent anesthesia complications Last Vitals:  Vitals Value Taken Time  BP 109/68 11/09/19 1230  Temp 36.9 C 11/09/19 1230  Pulse 86 11/09/19 1238  Resp 19 11/09/19 1238  SpO2 100 % 11/09/19 1238  Vitals shown include unvalidated device data.  Last Pain:  Vitals:   11/09/19 1230  TempSrc:   PainSc: 0-No pain      Patients Stated Pain Goal: 4 (11/09/19 1000)

## 2019-11-09 NOTE — Anesthesia Postprocedure Evaluation (Signed)
Anesthesia Post Note  Patient: Daci Shi-Trice Elamin  Procedure(s) Performed: OPEN REDUCTION INTERNAL FIXATION (ORIF) ANKLE SYNDESMOSIS FRACTURE (Left Ankle)     Patient location during evaluation: Phase II Anesthesia Type: General Level of consciousness: awake Pain management: pain level controlled Vital Signs Assessment: post-procedure vital signs reviewed and stable Respiratory status: spontaneous breathing Cardiovascular status: stable Postop Assessment: patient able to bend at knees and no apparent nausea or vomiting Anesthetic complications: no    Last Vitals:  Vitals:   11/09/19 1245 11/09/19 1300  BP: 117/78 119/77  Pulse: 76 80  Resp: 20 (!) 24  Temp:    SpO2: 100% 94%    Last Pain:  Vitals:   11/09/19 1255  TempSrc:   PainSc: 0-No pain   Pain Goal: Patients Stated Pain Goal: 4 (11/09/19 1000)    LLE Sensation: Numbness (11/09/19 1255)            Huston Foley

## 2019-11-09 NOTE — Anesthesia Preprocedure Evaluation (Signed)
Anesthesia Evaluation  Patient identified by MRN, date of birth, ID band Patient awake    Reviewed: Allergy & Precautions, H&P , NPO status , Patient's Chart, lab work & pertinent test results  Airway Mallampati: I  TM Distance: >3 FB Neck ROM: full    Dental no notable dental hx. (+) Teeth Intact   Pulmonary Current Smoker and Patient abstained from smoking.,    Pulmonary exam normal breath sounds clear to auscultation       Cardiovascular negative cardio ROS Normal cardiovascular exam Rhythm:regular Rate:Normal     Neuro/Psych PSYCHIATRIC DISORDERS Anxiety Depression    GI/Hepatic negative GI ROS,   Endo/Other  negative endocrine ROS  Renal/GU negative Renal ROS     Musculoskeletal   Abdominal Normal abdominal exam  (+)   Peds  Hematology negative hematology ROS (+)   Anesthesia Other Findings   Reproductive/Obstetrics negative OB ROS                             Anesthesia Physical Anesthesia Plan  ASA: II  Anesthesia Plan: General   Post-op Pain Management:  Regional for Post-op pain   Induction: Intravenous, Rapid sequence and Cricoid pressure planned  PONV Risk Score and Plan: Ondansetron, Dexamethasone and Midazolam  Airway Management Planned: Oral ETT  Additional Equipment: None  Intra-op Plan:   Post-operative Plan: Extubation in OR  Informed Consent: I have reviewed the patients History and Physical, chart, labs and discussed the procedure including the risks, benefits and alternatives for the proposed anesthesia with the patient or authorized representative who has indicated his/her understanding and acceptance.     Dental Advisory Given  Plan Discussed with: CRNA  Anesthesia Plan Comments:         Anesthesia Quick Evaluation

## 2019-11-09 NOTE — Brief Op Note (Signed)
11/09/2019  12:17 PM  PATIENT:  Molly Marshall  23 y.o. female  PRE-OPERATIVE DIAGNOSIS:  Left ankle distal tibio fibular disruption  POST-OPERATIVE DIAGNOSIS:  Left ankle distal tibio fibular disruption  PROCEDURE:  Procedure(s) with comments: OPEN REDUCTION INTERNAL FIXATION (ORIF) ANKLE SYNDESMOSIS FRACTURE (Left) - 90 mins  SURGEON:  Surgeon(s) and Role:    * Stann Mainland, Elly Modena, MD - Primary  PHYSICIAN ASSISTANT:   ASSISTANTS: none   ANESTHESIA:   regional and general  EBL:  5 mL   BLOOD ADMINISTERED:none  DRAINS: none  LOCAL MEDICATIONS USED:  MARCAINE     SPECIMEN:  No Specimen  DISPOSITION OF SPECIMEN:  N/A  COUNTS:  YES  TOURNIQUET:   Total Tourniquet Time Documented: Thigh (Left) - 39 minutes Total: Thigh (Left) - 39 minutes   DICTATION: .Note written in EPIC  PLAN OF CARE: Discharge to home after PACU  PATIENT DISPOSITION:  PACU - hemodynamically stable.   Delay start of Pharmacological VTE agent (>24hrs) due to surgical blood loss or risk of bleeding: not applicable

## 2019-11-09 NOTE — H&P (Signed)
ORTHOPAEDIC H and P  REQUESTING PHYSICIAN: Yolonda Kida, MD  PCP:  Jay Schlichter, MD  Chief Complaint: Left ankle pain  HPI: Molly Marshall is a 23 y.o. female who complains of left ankle pain and inability to bear weight following a ground-level injury back on December 14.  She presents today for open reduction internal fixation of the left ankle injury.  We have previously discussed my recommendation for this procedure based on the syndesmotic disruption and widening of the ankle mortise.  No new complaints at this time.  Past Medical History:  Diagnosis Date  . Cannabis dependence (HCC)    documented in epic ED visit 03-10-2019 positive urine drug screen  . Chronic hepatitis C (HCC)    followed by infectious disease-- Rexene Alberts NP  -- (11-01-2019 per pt was dx 02/ 2020)   . Eczema   . GAD (generalized anxiety disorder)   . Headache(784.0)   . Hepatitis B antibody positive    lab in epic 09-29-2019    . Injection of illicit drug within last 12 months    11-01-2019  per infectious disease noted dated 09-29-2019,  per pt last used 2018  . MDD (major depressive disorder)   . Opioid use disorder (HCC)    11-01-2019 per pt last used 2018,  however documented in epic ED visit 03-10-2019 oxycodone overdose  . Oppositional defiant disorder   . PTSD (post-traumatic stress disorder)    Past Surgical History:  Procedure Laterality Date  . NO PAST SURGERIES     Social History   Socioeconomic History  . Marital status: Single    Spouse name: Not on file  . Number of children: Not on file  . Years of education: Not on file  . Highest education level: Not on file  Occupational History  . 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 Smoking status: Light Tobacco Smoker    Years: 3.00    Types: Cigarettes  . Smokeless tobacco: Never Used  . Tobacco comment: occasional smoker  Substance and Sexual Activity  .  Alcohol use: Yes    Comment: rarely  . Drug use: Not Currently    Types: Solvent inhalants, Marijuana, MDMA (Ecstacy)    Comment: 11-01-2019  per pt last used 2018;   documented in epic ED visit 03-10-2019 drug overdose w/ snorted oxycodone  . Sexual activity: Not on file  Other Topics Concern  . Not on file  Social History Narrative  . Not on file   Social Determinants of Health   Financial Resource Strain:   . Difficulty of Paying Living Expenses: Not on file  Food Insecurity:   . Worried About Programme researcher, broadcasting/film/video in the Last Year: Not on file  . Ran Out of Food in the Last Year: Not on file  Transportation Needs:   . Lack of Transportation (Medical): Not on file  . Lack of Transportation (Non-Medical): Not on file  Physical Activity:   . Days of Exercise per Week: Not on file  . Minutes of Exercise per Session: Not on file  Stress:   . Feeling of Stress : Not on file  Social Connections:   . Frequency of Communication with Friends and Family: Not on file  . Frequency of Social Gatherings with Friends and Family: Not on file  . Attends Religious Services: Not on file  . Active Member of Clubs or Organizations: Not on file  .  Attends Archivist Meetings: Not on file  . Marital Status: Not on file   Family History  Problem Relation Age of Onset  . Mental illness Maternal Uncle   . Drug abuse Maternal Uncle   . Liver cancer Neg Hx   . Liver disease Neg Hx    No Known Allergies Prior to Admission medications   Medication Sig Start Date End Date Taking? Authorizing Provider  etonogestrel (NEXPLANON) 68 MG IMPL implant 1 each by Subdermal route once.   Yes [provider]  hydrocortisone 2.5 % lotion Apply topically 2 (two) times daily. Apply to face 10/07/19  Yes Peri Jefferson, PA-C  ibuprofen (ADVIL) 200 MG tablet Take 200 mg by mouth every 6 (six) hours as needed.   Yes [provider]  triamcinolone cream (KENALOG) 0.1 % Apply 1 application  topically 2 (two) times daily. X 7 days Do not apply to face 10/07/19  Yes Peri Jefferson, PA-C   No results found.  Positive ROS: All other systems have been reviewed and were otherwise negative with the exception of those mentioned in the HPI and as above.  Physical Exam: General: Alert, no acute distress Cardiovascular: No pedal edema Respiratory: No cyanosis, no use of accessory musculature GI: No organomegaly, abdomen is soft and non-tender Skin: No lesions in the area of chief complaint Neurologic: Sensation intact distally Psychiatric: Patient is competent for consent with normal mood and affect Lymphatic: No axillary or cervical lymphadenopathy  MUSCULOSKELETAL:  Left lower extremity.  Skin is warm and well-perfused and intact with no open wounds.  Neurovascular intact distal.  Assessment: Left ankle syndesmotic disruption  Plan: -Plan for today is for open reduction internal fixation of the syndesmotic disruption of the left ankle.  We again reviewed the risk and benefits of this procedure at length.  All questions were solicited and answered to her satisfaction.  She will be placed in a fracture boot postoperatively will be nonweightbearing for 6 weeks. -Discharge home from PACU with crutches and nonweightbearing.  Follow-up with me in 2 weeks postoperatively.    Nicholes Stairs, MD Cell [phone]    11/09/2019 10:08 AM

## 2019-11-09 NOTE — Op Note (Signed)
Date of Surgery: 11/09/2019  INDICATIONS: Ms. Molly Marshall is a 23 y.o.-year-old female who sustained a left ankle syndesmosis disruption; she was indicated for open reduction and internal fixation due to the displaced nature of the articular fracture and came to the operating room today for this procedure. The patient did consent to the procedure after discussion of the risks and benefits.  PREOPERATIVE DIAGNOSIS: left distal tibiofibular, syndesmotic disruption.  POSTOPERATIVE DIAGNOSIS: Same.  PROCEDURE: Open treatment of left ankle syndesmosis.  SURGEON:  P. Stann Mainland, M.D.  ASSIST: None.  ANESTHESIA:  general, and regional  TOURNIQUET TIME: 39 minutes at 250 mmHg  IV FLUIDS AND URINE: See anesthesia.  ESTIMATED BLOOD LOSS: 5 mL.  IMPLANTS:  Arthrex 2 hole small frag plate  Arthrex tight rope fixation device x2  COMPLICATIONS: None.  DESCRIPTION OF PROCEDURE: The patient was brought to the operating room and placed supine on the operating table.  The patient had been signed prior to the procedure and this was documented. The patient had the anesthesia placed by the anesthesiologist.  A nonsterile tourniquet was placed on the upper thigh.  The prep verification and incision time-outs were performed to confirm that this was the correct patient, site, side and location. The patient had an SCD on the opposite lower extremity. The patient did receive antibiotics prior to the incision and was re-dosed during the procedure as needed at indicated intervals.  The patient had the lower extremity prepped and draped in the standard surgical fashion.  The extremity was exsanguinated using an esmarch bandage and the tourniquet was inflated to 250 mm Hg.   Incision was made over the distal fibula at the level of the distal tibiofibular joint.  Dissection was carried down to the fibula which was noted to be completely intact.  We elevated periosteum to make room for 2 hole distal fibula plate.   Next, we turned our attention to the syndesmotic disruption.  Stress radiography demonstrated a completely unstable syndesmosis.  We applied a King tong clamp to the medial malleolus and distal fibula to reduce the mortise anatomically with the foot at neutral dorsiflexion.  Once this reduction was achieved we then drilled for our first tight rope which was in the distal position.  We drilled across all 4 cortices of the fibula and tibia.  We then tensioned the tight rope with the foot at neutral dorsiflexion and the heel elevated.  Intraoperative radiography was used to confirm the mortise was congruent.  The King tong clamp was left in place.  We then repeated the above steps with our proximal tight rope fixation device.  Both tight rope devices were then tightened once again.  The clamp was removed.  We then performed AP, lateral, and mortise radiography intraoperatively to confirm adequate mortise reduction.  There was no loosening of our fixation.   The wounds were irrigated, and closed with vicryl with routine closure for the skin. The wounds were injected with local anesthetic. Sterile gauze was applied followed by a posterior splint. She was awakened and returned to the PACU in stable and satisfactory condition. There were no complications.    POSTOPERATIVE PLAN: Ms. Mohon will remain nonweightbearing on this leg for approximately 6 weeks; Ms. Knick will return for suture removal in 2 weeks.  She will be placed in a cam walker boot in PACU.  She will be strictly nonweightbearing.  Ms. Mraz will receive DVT prophylaxis based on other medications, activity level, and risk ratio of bleeding to thrombosis.  Corene Cornea  Johnnette Litter, MD [facility] [place] [phone] 12:18 PM

## 2019-11-09 NOTE — Progress Notes (Signed)
Assisted Dr. Jillyn Hidden with left, ultrasound guided, femoral, popliteal, adductor canal block. Side rails up, monitors on throughout procedure. See vital signs in flow sheet. Tolerated Procedure well.

## 2019-11-09 NOTE — Anesthesia Procedure Notes (Signed)
Anesthesia Regional Block: Popliteal block   Pre-Anesthetic Checklist: ,, timeout performed, Correct Patient, Correct Site, Correct Laterality, Correct Procedure, Correct Position, site marked, Risks and benefits discussed,  Surgical consent,  Pre-op evaluation,  At surgeon's request and post-op pain management  Laterality: Lower and Left  Prep: chloraprep       Needles:  Injection technique: Single-shot  Needle Type: Echogenic Stimulator Needle     Needle Length: 10cm  Needle Gauge: 21   Needle insertion depth: 1 cm   Additional Needles:   Procedures:,,,, ultrasound used (permanent image in chart),,,,  Narrative:  Start time: 11/09/2019 10:42 AM End time: 11/09/2019 10:47 AM Injection made incrementally with aspirations every 5 mL.  Performed by: Personally  Anesthesiologist: Lyn Hollingshead, MD

## 2019-11-10 ENCOUNTER — Other Ambulatory Visit: Payer: Self-pay | Admitting: Orthopedic Surgery

## 2019-11-10 MED ORDER — METHOCARBAMOL 500 MG PO TABS: 500.0000 mg | ORAL_TABLET | Freq: Three times a day (TID) | ORAL | 1 refills | Status: DC | PRN

## 2019-11-10 NOTE — Progress Notes (Signed)
Patient called with increased pain after block wore off from surgery

## 2019-11-15 ENCOUNTER — Telehealth: Payer: Self-pay | Admitting: Pharmacy Technician

## 2019-11-15 NOTE — Telephone Encounter (Signed)
[facility] Patient Advocate Encounter  Reached out to Molly Marshall to discuss needed information for Mavyret patient assistance.  She agreed to send her 38 and other household members' bank statement (showing their unemployment payments).  That is the final step needed to get approval.  We will continue to follow at the clinic.  Molly Marshall. Dimas Aguas CPhT [facility] Phone: [phone] Fax:  [phone]

## 2019-11-27 NOTE — Telephone Encounter (Signed)
[facility] Patient Advocate Encounter  Patient approved to receive Mavyret at no charge until 05/21/2020. They will reach out to her to setup first shipment. She will need counsel and 4 week appointment after the medication has arrived. She has alerted her apartment complex that she will be receiving a package as well.

## 2019-12-07 NOTE — Telephone Encounter (Signed)
[facility] Patient Advocate Encounter  Tried to call the patient to follow back up but none of the numbers on file had a voicemail setup or answer. Will try again tomorrow.

## 2020-01-03 ENCOUNTER — Telehealth: Payer: Self-pay | Admitting: Pharmacy Technician

## 2020-01-03 NOTE — Telephone Encounter (Signed)
[facility] Patient Advocate Encounter  Patient approved to receive Mavyret from [facility].  First shipment sent to patient on 12/13/2019.  [facility] called the patient and left voicemail on 02/08 to set up 2nd shipment, they were unable to reach the patient. I called Deliah and provided her the pharmacy number to call and get her second box [id number] shipped.  She stated that she has not started the medication yet because she doesn't feel ready. When asked, she said she would not start until she knew she could take all the tablets.   Will continue to follow to find out if she started and schedule the follow-up appointment.  Number I reached her on: [phone] Another cell #: [phone] Cousin cell # as backup: [phone]

## 2020-02-28 ENCOUNTER — Telehealth: Payer: Self-pay | Admitting: Pharmacy Technician

## 2020-02-28 NOTE — Telephone Encounter (Signed)
[facility] Patient Advocate Encounter  Patient received both boxes of Mavyret medication. First shipment was 12/13/2019 and the second received on 01/18/2020. When I spoke to her she wasn't feeling mentally up to beginning treatment. She is currently pregnant and seeking treatment at [facility] in [place]. She knows not to start treatment for the HepC while pregnant. Will need to pend until after December 2021 to begin her start.

## 2020-12-02 ENCOUNTER — Ambulatory Visit
Admission: EM | Admit: 2020-12-02 | Discharge: 2020-12-02 | Discharge status: Against Medical Advice | Payer: Medicaid Other

## 2020-12-02 ENCOUNTER — Other Ambulatory Visit: Payer: Self-pay
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
## 2021-12-18 ENCOUNTER — Ambulatory Visit: Payer: Medicaid Other | Admitting: Sports Medicine

## 2021-12-18 ENCOUNTER — Other Ambulatory Visit: Payer: Self-pay

## 2021-12-18 ENCOUNTER — Ambulatory Visit (INDEPENDENT_AMBULATORY_CARE_PROVIDER_SITE_OTHER): Payer: Medicaid Other

## 2021-12-18 ENCOUNTER — Encounter: Payer: Self-pay | Admitting: Sports Medicine

## 2021-12-18 DIAGNOSIS — S99911A Unspecified injury of right ankle, initial encounter: Secondary | ICD-10-CM

## 2021-12-18 DIAGNOSIS — S93401A Sprain of unspecified ligament of right ankle, initial encounter: Secondary | ICD-10-CM | POA: Diagnosis not present

## 2021-12-18 NOTE — Progress Notes (Signed)
Subjective: Molly Marshall is a 26 y.o. female patient who presents to office for evaluation of right ankle pain. Patient complains of continued pain in the ankle after spraining it 2 days ago while dancing patient reports that she has a lot of swelling and stiffness states that the swelling and stiffness seems a little bit better today but states that she has to walk on her toes to avoid putting pressure on her ankle.  Patient reports that she has tried resting and elevating and ice but ice seems to make it more painful and stiff.  Patient denies previous history of injury to this ankle however does state that sometimes she has had a history in the past of spraining but not like this.  Patient denies any other pedal complaints at this time.  Patient Active Problem List   Diagnosis Date Noted   Chronic hepatitis C without hepatic coma (HCC) 09/29/2019   Injection of illicit drug within last 12 months 09/29/2019   Underinsured 09/29/2019   Cannabis dependence (HCC) 01/31/2014   Opioid use disorder, moderate, in early remission (HCC) 01/31/2014   ODD (oppositional defiant disorder) 01/31/2014   MDD (major depressive disorder), recurrent episode, moderate (HCC) 01/28/2014   Chronic motor or vocal tic disorder 08/22/2013   PTSD (post-traumatic stress disorder) 08/24/2012    Current Outpatient Medications on File Prior to Visit  Medication Sig Dispense Refill   acetaminophen (TYLENOL) 325 MG tablet Take by mouth.     hydrocortisone 2.5 % cream Apply topically.     ibuprofen (ADVIL) 800 MG tablet ibuprofen 800 mg tablet   800 mg by oral route.     etonogestrel (NEXPLANON) 68 MG IMPL implant 1 each by Subdermal route once.     No current facility-administered medications on file prior to visit.    No Known Allergies  Objective:  General: Alert and oriented x3 in no acute distress  Dermatology: No open lesions noted to the right ankle.  There is moderate soft tissue swelling lateral  and also medial ankle on the right.  Vascular: Dorsalis Pedis and Posterior Tibial pedal pulses palpable on the right.  Neurology: Gross sensation intact via light touch on the right.  Musculoskeletal: Mild to moderate tenderness with palpation at right ankle at medial and lateral ankle lateral ligaments.  There is mild guarding due to pain range of motion unable to be completely assessed due to limits of pain difficult to test instability due to pain on the right ankle.  Xrays  Right ankle   Impression: No acute osseous finding.  No fracture or dislocation.  Assessment and Plan: Problem List Items Addressed This Visit   None Visit Diagnoses     Injury of right ankle, initial encounter    -  Primary   Relevant Orders   DG Ankle Complete Right   DME Crutches   Moderate right ankle sprain, initial encounter            -Complete examination performed -Xrays reviewed -Discussed treatement options for likely moderate ankle sprain -Applied Unna boot dressing and advised patient to keep intact for 3 days after 3 days may remove and use Ace wrap for any residual swelling -DME order placed for crutches and gave patient prescription to go to local DME store to try to get crutches if needed -Dispense a heel offloading surgical shoe and advised patient for weightbearing may toe-touch as tolerated -Advised patient to continue with rest ice elevation and over-the-counter ibuprofen or Tylenol as needed for pain -  Advised patient if her job cannot accommodate her may need to take a week off to allow her ankle to get better before she returns since she is a Conservation officer, 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 -Patient to return to office in 3 weeks or sooner if condition worsens.  Asencion Islam, DPM

## 2022-10-06 ENCOUNTER — Ambulatory Visit (HOSPITAL_COMMUNITY)
Admission: EM | Admit: 2022-10-06 | Discharge: 2022-10-06 | Disposition: A | Discharge status: Home / Self Care | Payer: Medicaid Other | Attending: Nurse Practitioner | Admitting: Nurse Practitioner

## 2022-10-06 DIAGNOSIS — F4310 Post-traumatic stress disorder, unspecified: Secondary | ICD-10-CM | POA: Insufficient documentation

## 2022-10-06 DIAGNOSIS — Z9151 Personal history of suicidal behavior: Secondary | ICD-10-CM | POA: Insufficient documentation

## 2022-10-06 DIAGNOSIS — F1510 Other stimulant abuse, uncomplicated: Secondary | ICD-10-CM | POA: Insufficient documentation

## 2022-10-06 NOTE — Discharge Instructions (Signed)

## 2022-10-06 NOTE — ED Provider Notes (Signed)
[facility] Medical Screening Exam  Patient Name: Molly Marshall MRN: [id number] Date of Evaluation: 10/06/22 Chief Complaint:   Diagnosis:  Final diagnoses:  Mild methamphetamine abuse (HCC)    History of Present illness: Molly Marshall is a 26 y.o. female.  With psychiatric history of opioid use disorder, SI, depression, anxiety, PTSD, and methamphetamine use, who presented voluntarily as a walk-in to [facility] seeking outpatient resources for substance abuse treatment/detox from methamphetamine.  On evaluation, patient is alert, oriented x 4, and cooperative. Speech is clear, coherent and logical. Pt appears well groomed. Eye contact is fair. Mood is euthymic, affect is congruent with mood. Thought process is logical and thought content is coherent. Pt denies SI/HI/AVH. There is no indication that the patient is responding to internal stimuli. No delusions elicited during this assessment.    Patient reports "I have a court date, and I'm taking my kid's father to court for sole custody, and I want to remain clean, so I want to stop using and I'm just here to seek for resources and how to detox from methamphetamine which I have been using for the past 2 months, and last used yesterday, and I use tiny amounts about $40 weekly".  Patient denies current withdrawal symptoms.  Patient denies other substance use. Patient denies SI, denies HI, denies AVH or paranoia.  She reports she lives with her baby and denies access or means to a gun or weapon at home.  Patient reports her sleep is great and appetite is fair.  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  Patient reports going for detox/treatment from fentanyl at a 1 year treatment program 3 years ago, and stayed clean 2 years afterwards and then recently relapsed in September.  Patient did not identify any stressors, but reports she is motivated to stop use because she is sleeping so custody of her child and  did not want her substance abuse to stand in the way. Patient reports SI as a teenager due to depression.  Patient denies being depressed or experiencing signs and symptoms of depression.  Patient reports she sees a therapist at [facility] every other month, and last saw her therapist a month ago.  Patient reports she was prescribed Prozac for her depression by a psychiatrist at same facility, but she stopped taking the medication at the beginning of the summer.  Patient did not provide a reason why she is noncompliant with her medication.   Patient educated on antidepressants, in terms of how they work, risks versus benefits of use, indications for use, lack of dependence or addiction with this class of medication and encouraged to contact/follow up with her outpatient psychiatric provider and take her medications as prescribed.  Patient verbalized her understanding.  Support, encouragement, and reassurance provided about ongoing stressors.  Patient provided with opportunity for questions.   Psychiatric Specialty Exam  Presentation  General Appearance:Appropriate for Environment  Eye Contact:Good  Speech:Clear and Coherent  Speech Volume:Normal  Handedness:Right   Mood and Affect  Mood: Euthymic  Affect: Congruent   Thought Process  Thought Processes: Coherent  Descriptions of Associations:Intact  Orientation:Full (Time, Place and Person)  Thought Content:WDL    Hallucinations:None  Ideas of Reference:None  Suicidal Thoughts:No  Homicidal Thoughts:No   Sensorium  Memory: Immediate Good  Judgment: Fair  Insight: Good   Executive Functions  Concentration: Good  Attention Span: Good  Recall: Good  Fund of Knowledge: Good  Language: Good   Psychomotor  Activity  Psychomotor Activity: Normal   Assets  Assets: Communication Skills; Desire for Improvement; Housing; Physical Health; Social Support;  Resilience   Sleep  Sleep: Fair  Number of hours: No data recorded  Nutritional Assessment (For OBS and [facility] admissions only) Has the patient had a weight loss or gain of 10 pounds or more in the last 3 months?: No Has the patient had a decrease in food intake/or appetite?: No Does the patient have dental problems?: No Does the patient have eating habits or behaviors that may be indicators of an eating disorder including binging or inducing vomiting?: No Has the patient recently lost weight without trying?: 0 Has the patient been eating poorly because of a decreased appetite?: 0 Malnutrition Screening Tool Score: 0    Physical Exam: Physical Exam Constitutional:      General: She is not in acute distress.    Appearance: She is not diaphoretic.  HENT:     Head: Normocephalic.     Right Ear: External ear normal.     Left Ear: External ear normal.     Nose: No congestion.  Eyes:     General:        Right eye: No discharge.        Left eye: No discharge.  Pulmonary:     Effort: No respiratory distress.  Chest:     Chest wall: No tenderness.  Neurological:     Mental Status: She is alert and oriented to person, place, and time.  Psychiatric:        Attention and Perception: Attention and perception normal.        Mood and Affect: Mood and affect normal.        Speech: Speech normal.        Behavior: Behavior is cooperative.        Thought Content: Thought content normal. Thought content is not paranoid or delusional. Thought content does not include homicidal or suicidal ideation. Thought content does not include homicidal or suicidal plan.        Cognition and Memory: Cognition and memory normal.        Judgment: Judgment normal.    Review of Systems  Constitutional:  Negative for chills, diaphoresis and fever.  HENT:  Negative for congestion.   Eyes:  Negative for pain and discharge.  Respiratory:  Negative for cough, shortness of breath and wheezing.    Cardiovascular:  Negative for chest pain and palpitations.  Gastrointestinal:  Negative for diarrhea, nausea and vomiting.  Neurological:  Negative for focal weakness, seizures, weakness and headaches.  Psychiatric/Behavioral:  Positive for substance abuse. Negative for depression, hallucinations, memory loss and suicidal ideas. The patient is not nervous/anxious and does not have insomnia.    Blood pressure 125/82, pulse (!) 102, temperature 98.7 F (37.1 C), temperature source Oral, resp. rate 18, SpO2 100 %. There is no height or weight on file to calculate BMI.  Musculoskeletal: Strength & Muscle Tone: within normal limits Gait & Station: normal Patient leans: N/A   [facility] MSE Discharge Disposition for Follow up and Recommendations: Based on my evaluation the patient does not appear to have an emergency medical condition and can be discharged with resources and follow up care in outpatient services for Substance Abuse Intensive Outpatient Program   Recommend discharge home and follow up with Intensive outpatient SA treatment program.  LCSW provided patient with outpatient SA treatment/detox resources and patient encouraged to follow-up.  Patient does not meet any psychiatric admission criteria  or IVC criteria.  There is no evidence of imminent risk of harm to self or others.  Discussed methods to reduce the risk of self-injury or suicide attempts: Frequent conversations regarding unsafe thoughts. Remove all significant sharps. Remove all firearms. Remove all medications, including over-the-counter meds. Consider lockbox for medications and having a responsible person dispense medications until patient has strengthened coping skills. Room checks for sharps or other harmful objects. Secure all chemical substances that can be ingested or inhaled.   Please refrain from using alcohol or illicit substances, as they can affect your mood and can cause depression, anxiety or other concerning  symptoms.  Alcohol can increase the chance that a person will make reckless decisions, like attempting suicide, and can increase the lethality of a drug overdose.     Discussed crisis plan, calling 911, or going to the ED if condition changes or worsens.  Patient verbalized her understanding.  Patient discharged home and condition at discharge is stable.  Mancel Bale, NP 10/06/2022, 8:46 PM

## 2023-02-16 ENCOUNTER — Ambulatory Visit (HOSPITAL_COMMUNITY)
Admission: EM | Admit: 2023-02-16 | Discharge: 2023-02-16 | Disposition: A | Discharge status: Home / Self Care | Payer: Medicaid Other | Attending: Registered Nurse | Admitting: Registered Nurse

## 2023-02-16 ENCOUNTER — Encounter (HOSPITAL_COMMUNITY): Payer: Self-pay | Admitting: Registered Nurse

## 2023-02-16 DIAGNOSIS — Z789 Other specified health status: Secondary | ICD-10-CM | POA: Diagnosis present

## 2023-02-16 DIAGNOSIS — F1920 Other psychoactive substance dependence, uncomplicated: Secondary | ICD-10-CM | POA: Insufficient documentation

## 2023-02-16 NOTE — Discharge Instructions (Signed)
SUBSTANCE USE TREATMENT for Medicaid/Medicare and State Funded/IPRS  [facility] [address], [place], [address] [phone] phone NOTE: [facility] is no longer offering IOP services.  Serves those who are low-income or have no insurance.  [facility] [address], [place], [place], [address] [phone] phone [phone] fax NOTE: Does have Substance Abuse-Intensive Outpatient Program [facility]) as well as transitional housing if eligible.  [facility] [address] [place], [place], [address] [phone] phone [phone] fax  [facility] [phone] [address] [place], [place], [address] [phone] phone [phone] fax    [facility] Website:  [email or web address] Address:  [address] [place], [place], [place] [address] Phone:  [phone]    CORE PROGRAMS Medication-Assisted Treatment > Our goal is to help you achieve and maintain a substance abuse-free life. We know this can be especially challenging for you, especially if you battle an addiction to opioids (heroin, oxycodone, hydrocodone, etc.). [facility] is an accredited facility allowed to provide medications that can help you medically and emotionally, so you can stop misusing prescription or illegal drugs. Adolescent Programs > Women's Recovery Center > Treatment with Housing (BATS) > We have special programs for adolescents and women to address their specific needs and provide a safe place for health and recovery. We also have programs that can help with housing and transportation.   Residential Treatment Programs > Residential treatment allows you to focus solely on your substance misuse and mental health recovery without distraction from some of life's stressors. By living on campus, you can be monitored and supported by staff 24 hours a day. [facility] residential treatment programs can be found at the [employer]. [facility] in [place], [place], within the beautiful campus of the [facility] [employer]. Erling Conte is not a locked facility. All clients are at [facility] by choice.  OUTPATIENT TREATMENT Outpatient Treatment Programs > Periodic group, individual and family counseling services provide support to persons seeking recovery. Our counselors are trained in providing substance abuse-specific treatment utilizing several evidence-based approaches including motivational interviewing, cognitive behavioral therapy, trauma therapy and others. Substance Abuse Intensive Outpatient Treatment > Individual and group activities are the focal point of this program. We work on assisting you to begin recovery and learn the skills you will need to stay on your new healthy path. You will meet three days a week, individually and with a group, as we focus on recovery stabilization. This program is designed to provide intensive services and allow you to stay at home and not miss work as you begin your recovery journey. Substance Abuse Comprehensive Outpatient Treatment > This program allows for structure and support, so you can achieve and maintain your path to wellness and recovery. We emphasize reducing or eliminating your harmful use of substances, stabilizing mental illness symptoms, and we help you develop a healthy social network. [facility] counselors can also help address lifestyle changes and help you focus on developing educational or work skills and improving your family situation if these are goals you would like to set. You will meet up to five days a week, including individual and group counseling. This program is designed to help people who have recently been discharged from a hospital or crisis center, or to prevent hospitalization due to substance misuse issues or severe symptoms of mental illness.    We give priority admission if you are pregnant, an intravenous drug user, are at risk of HIV/AIDS or tuberculosis (TB),  or  have opioid management treatment needs.    Long Term (>30 Days) Residential Treatment Centers in [place]  See all treatment centers in [facility] [employer] / Inpatient Price  [facility] Verified [place], [place] [address]  [facility] is a premier accredited residential mental health treatment facility for adults in [place], [place]. We offer a continuum of care that includes PHP and IOP in addition to residential services. Our holistic behavioral health home model focuses on complete psychiatric, medical and spiritual wellness with access to full psychiatric diagnostic and assessment services and psychological testing. In addition to traditional and group therapy (CBT based), we also offer integrative therapies including art, music, recreation, Clinical research associate, health & wellness, pet and pastoral care. [phone]  [facility] Verified [place], [place] [address]  [facility] is a clinically dynamic, dual-diagnosis residential treatment program for adolescent males ages 36-17. Through a holistic approach, we help emerging young men address trauma, substance use, mental health issues, grief and loss, and adoption/divorce issues. Equine therapy, physical fitness, nutritional education, adventure and experiential therapies, and animal husbandry complement clinically intensive programming. An academic component is also included, giving clients the opportunity to stay on track with their schooling while enrolled at [employer]. To provide healing and growth for the entire family system, we incorporate an emotionally focused Family Therapy Program centered around improving parent-child relationships and communication skills for sustained recovery. [facility] also offers Recovery Management services based on the Gorski-CENAPS model of relapse prevention for clients who have  experienced or are about to experience a return to use after roughly 90 days of sobriety. [phone]   Neurosur[facility] - Adult Outpatient Treatment [place], [place] [address]  [employer] [facility] is a premier addiction rehabilitation center located in [place], [place]. We accept patients age 61 and older who are struggling with symptoms of substance use disorders and certain co-occurring mental health concerns. We are dedicated to using an individualized approach to treatment and customize each patient's treatment plan according to their unique needs and goals. We offer several programming options to meet patients where they are on the journey to healing, including a partial hospitalization program (PHP) that supports patients who need structure without 24-hour supervision. Though many of our PHP patients begin in our detox or residential program, we have some patients who begin treatment at the [facility] level. This program typically lasts up to 28 days; however, the actual duration will depend on the personal needs of the patient. [phone]  [facility] Verified [place], [place] [address]  By exploring foundational issues that precipitate substance use and other disorders, our programs give clients the resources to be successful in long-term recovery, become more self-sufficient, identify triggers that can lead to relapse, build healthy relationships, and learn positive coping skills. [facility] has three clinically driven, trauma-focused programs that offer gender-specific care to young adult men ages 66-30 ([place]), young adult women ages 55-35 ([place]), and adolescent boys ages 57-17 ([place]). Our master's level clinicians, dually licensed to treat mental health and addiction, integrate research-supported practices with complementary modalities to help clients honor themselves,  recognize self-worth, and pursue positive, lasting change. [facility] and [employer] generally provide primary substance use treatment, while the Edwena Felty can also provide treatment for primary mental health. Our developmentally specific treatment methods consider clients' unique stories, trauma history, gender challenges, substance use history,  relapse triggers, and mental health issues to allow for holistic treatment of the whole person. [facility] also offers Recovery Management services based on the Gorski-CENAPS model of relapse prevention for clients who have experienced or are about to experience a return to use after roughly 90 days of sobriety. Our clinicians will work alongside a Gorski-trained relapse prevention specialist to help these clients receive the [phone]  [facility] [place], [place] [address]  [facility] is a recognized leader for its effectiveness in twelve-step residential treatment, research-based recovery services and education for substance use and co-occurring disorders. Through an exceptional staff of clinical and medical professionals, we provide intensive residential and outpatient treatment for adults and young adults, extended care for professionals and women with trauma and persistent relapse issues, alumni support and family education services for adult men, women and children. Omer [facility] is a Dentist and is fully accredited by [employer] on Optician, dispensing, CARF. [phone]   [facility] [place], [place] [address]  We offer struggling individuals a chance at freedom from addiction and alcoholism through comprehensive treatment. If you need help, recovery is possible. At [facility], every individual is met with the compassion and the care that they need in order to truly heal. Our physicians and psychologists work hand in hand to  come up with personalized treatment strategies which are meant to address individualized needs. These strategies are available in both outpatient and residential aspects, depending on an individual's history of addiction and day-to-day responsibilities. Recovery from addiction is challenging. With the help and support from compassionate case members, therapists, and staff within the recovery community here at [facility], individuals struggling with addiction have a chance at a new beginning. [phone]   [address] - Adult Residential Treatment [place], [place] [address]  [address], located in [place], [place], provides residential eating disorder treatment for people who are struggling with anorexia, bulimia, and other eating disorders. We also offer comprehensive treatment for people who have co-occurring mental health concerns. We provide care at two locations: Our first facility is designed for women age 1 and older, and our second facility serves adults age 22 and older of all genders. The goal of both residential treatment programs is to help people find healing and lasting freedom from the burdens of eating disorders. The average length of stay at this level of care is 30-60 days, but each person's actual length of stay depends on their unique needs. Treatment at our facility incorporates a number of therapies, including dialectical behavior therapy (DBT), exposure and response prevention (ERP), motivational interviewing (MI), and cognitive behavioral therapy (CBT). While in treatment, clients can also benefit from medical care, medication management services, and individual, group, family, and experiential therapies. [phone]   [facility], MS, [facility], LCAS, CCS [place], [place] [address]  [facility] provides more than just transitional living homes. We are a supportive community of peers and addictions professionals  committed to substance abuse recovery. Our safe, close-knit community and highly structured programs ensure our residents have the best support possible for a successful and long-term recovery. Now offering an Intensive Out Patient (IOP) program available to the [place] community. [facility] also is a MAT friendly program and supports men across mental health and substance use disorders. [phone]  [facility] [place], [place] [address]  Our healing mission is to improve the lives of individuals impeded  by mental health challenges or mental illness to achieve their highest levels of functioning and fulfillment. [facility] progressive residential healing communities and transitional living program are located in the [address] of [place] in [place]. We offer hope and healing to adults who struggle with mental health challenges. Our approach is not based on "quick fixes" but grounded in research and the proven healing power of community in a residential setting. Our philosophy: mind and heart working together. Simply put, we believe that whole person care, delivered in a community setting is the most powerful and effective way to address challenging mental health issues. With an integrated system of care, [facility] offers six levels of treatment, The [facility] at [facility] (residential treatment), [facility] at [employer] (residential treatment), Extended Care at the [employer] (semi-transitional), The [employer] (transitional living with two levels of support), and a Partial Hospitalization Program or "PHP" (outpatient care). [phone]  [facility]  [facility] [address], [place], LPC, LCAS, CCS [place], [place] [address]  Located in heart of the beautiful [facility], [facility] provides an Intensive Outpatient Program known as IOP to The [place]  [place], [address] with the support of on-site certified & licensed professionals. [facility] serves men between 55 and 62 years old who are striving for Recovery, have completed in-patient treatment such as residential treatment or medical detox, have 30 days clean and sober, or for men whose outpatient treatment services were not effective. In another program offering, [facility] is integrated with [facility] [place]'s Transitional Living Program to provide a unique Extended Care Recovery Treatment model. This program offers an array of high-quality recovery services and programs intertwined with Recovery Home and Sober Living Environments. Since 2006, [facility] has been providing high-quality programs and is known as one of the best programs around today offering Relapse Prevention, Lifeskills Classes, 12-step support, Outdoor Adventure Therapy, & so much more. We are loving, committed, structured, and are here to help you gain back integrity, strength, and self-worth. Contact us Today [phone]      [facility] [place], [place] [address]  Trails uses the proven elements of wilderness therapy, clinical theory and evidenced-based psychotherapies with a residential and academic base camp to create a program that is clinically driven and results focused. [facility] [place] was created to help families reconnect and heal. [facility] [place] is a wilderness therapy program for pre-teens and adolescents ages 51-17. The clinically sophisticated and time-tested program engages students through wilderness therapy, mindfulness and yoga, equine assisted therapy, intentional transitions, and academic engagement. Recent outcome research demonstrates efficacy. For additional information about [facility] [place], located just outside of [place], [place], please call [phone]. [phone] 15 Second Videos          [facility] Verified [place], [place] [address]  [facility] offers inpatient, residential, partial hospitalization and intensive outpatient programs (PHP/IOP), and outpatient sessions in [place] and [place]. [facility] offers specialty treatment for eating disorders dedicated to giving all people access to best-practice care and the tools they need for lasting recovery. [facility] offers all levels of care, from 24/7 inpatient care through outpatient services across multiple states. In-person and virtual treatment options are available. Multidisciplinary treatment teams aim to equip individuals, families, and communities with the skills necessary to continue recovery in the home environment. Our admissions team is available to help you, your loved one, or your client get  started Monday through Thursday from 8:00 AM- 8:00 PM ET, Fridays 8:00 AM-7:30 PM ET, and Saturdays and Sundays from 10:00 AM-6:00 PM. [facility] has three locations in [place], [place] with additional locations in [place], [place], and [place]. Our [place] locations are [place] for IP/RES ages 23-18, [place] for IP/RES ages 9 and up, and [place] for PHP/IOP/OP for ages 15 and up. Click the "Nearby Areas" button on the right to find a location near you. [phone]  [facility], [place], [facility] [place], [place] [address]  We give each person the best chance at long term recovery with our many services offered here. We are a small intimate facility treating substance abuse with, or without, mental health disorders. We pride ourselves in our staffs dedication to recovery and many years of experience in the addiction and mental health fields. [facility] is dedicated to helping those wishing to overcome substance abuse issues. [phone]   [facility] Verified [place], [place]  [address]  Teens in our outpatient and residential programs build self-compassion, resilience, healthy coping skills, and more trusting relationships with parents and peers. At [facility], we know that treatment financing can make all the difference in receiving quality treatment. To that end, we promote collaborative relationships with all healthcare payers to optimize the benefit coverage for families in need. We accept all major insurance, and up to 100 percent of our services are covered. Our goal is to partner with insurers to address the adolescent and young adult mental health crisis, by reaching more families and helping them achieve sustainable recovery. If you're interested in exploring the possibility of treatment at [facility] for your loved one's mental health, behavioral health, or substance abuse issues, we can begin the insurance verification process immediately. Furthermore, we are happy to obtain your insurance policy information and seek verification on your behalf. You can also expedite this process by completing the insurance verification form on our website. There is no obligation to either [employer] or to your insurance provider. [phone]  [facility], CCS, LCAS, LCSW, CSAC [place], [place] [address]  [facility] is [place]. At [facility] we offer multiple levels of addiction & alcoholism treatment programs, each tailored to the clients' specific issues and needs. All of our programs are designed to help you gain insight into the disease of addiction while acquiring the life skills needed to sustain long-term recovery. [facility]'s professional, experienced, and caring staff develops individualized treatment plans for each patient. Therapies that will help every individual learn to manage their behavioral health problems. The path to sobriety requires patience and dedication on behalf of  the addict and their family. The decision to enter rehab is an important one, and we at [facility] understand and respect that.Our drug rehab program focuses on changing the patterns and daily habits that naturally develop when a loved one is addicted to drugs. Our therapists will also address the underlying psychological issues that may have caused the drug abuse in the first place. Clients will receive individual and necessary therapeutic attention while working on and replacing bad habits with good ones, and creating a healthy lifestyle. [phone]  [facility] Adolescent [facility]idential Treatment [place], [place] [address]  Our residential program provides age-appropriate addiction treatment for youths ages 29-17. Adolescents' bodies and brains are still developing, so when young people struggle with addictions, they can be negatively impacted in unique ways. Adolescents  who have substance use disorders can experience impaired brain development, academic problems, and struggles with interpersonal relationships. They can also be at risk for developing mental health concerns. At [facility], we believe that addressing mental health concerns and addictions together through dual diagnosis treatment can lead to the most optimal results for young people. We offer three dual diagnosis programs that are organized by gender and age group. Our [employer] serves youths ages 34-12 of all genders who are suffering from mental health concerns and co-occurring addictions. We also have residential programs for teen girls and boys ages 65-17 who need dual diagnosis treatment. [phone]  [facility] Verified [place], [place] [address]  [facility], is a nationally recognized behavioral healthcare provider. Our psychiatric residential treatment facility (PRTF), provides comprehensive residential treatment to youth ages 18 to 1. Our  facility is fully accredited by the Joint Commission and our programming offers Trauma Focused Evidence Based practices that produce improved outcomes for those we serve. Our programs offer treatment to those with a history of: sexually harmful behaviors, trauma, self injury, failed attempts at lower levels of care, repeat stays in acute inpatient settings, aggression and violence towards others. This setting also operates a fully accredited private school and offer medical/dental care and on-site recreational therapy services. [phone]   [facility] Verified [place], [place] [address]  [facility] has been the pioneer in the treatment of eating disorders since 1985. As the nation's first residential eating disorder facility, now with 19 locations throughout the country, [facility] has helped more than 100,000 cisgender adolescent girls and adult women, transgender and non-binary individuals with eating disorders move towards recovery. [facility] provides women suffering from anorexia nervosa, bulimia nervosa, binge eating disorder, and related mental health problems with the tools they need to succeed in recovery and in life. [facility]'s extensive range of services includes residential, day treatment, intensive outpatient, and outpatient programs. Each treatment level is built upon [employer] for Eating Disorders, an evidence-based, emotion-focused therapy that addresses eating disorders and co-morbid symptoms. Within this model, individual and group therapy are enhanced with a diverse array of services to meet patients' needs. [facility] accepts most major insurances and is a preferred provider for all levels of treatment. [phone]   [facility] Verified [place], [place] [address]  [facility] offers the highest caliber of evidence-based care in  the nation for adolescents 12-17 struggling with mental health and addiction issues. We specialize in teens battling depression, anxiety, trauma, emotion dysregulation, high-risk/self-harm behaviors, Oppositional Defiant Disorder, ADHD, addiction, suicidal ideation, and other emotional/behavioral issues. [facility] treats teens--and teens only. Our treatment approach emphasizes Dialectical Behavior Therapy (DBT) and Cognitive Behavior Therapy, along with other evidence-based modalities such as Seeking Safety (trauma), Relapse Prevention, Behavioral Activation, Motivational Interviewing, Mindfulness-Based Cognitive Therapy and 12-Step support programs. Our goal is genuine recovery that lasts long after your child leaves treatment. [facility]'s robust residential program includes individual and family therapy, psychiatry, group therapy, experiential therapies (e.g. equine, surf, art, music, drama, yoga, etc.) and 24/7 skills-coaching. For out-of-state families, we offer family therapy via HIPAA-compliant video conferencing. We also provide daily academic support to keep teens on track with school. [phone]  [facility] [place], [place] [address]  Only serving up to six teens at any given time, [facility]'s residential programs are suitable for teens stepping down from inpatient  hospitalization, or as a step-up option for teens requiring a higher level of care than partial hospitalization or intensive outpatient programs. At [facility], we provide the highest caliber of evidence-based treatment for teens ages 59-17 struggling with depression, anxiety, substance abuse, dual diagnosis, ADHD, self-harming or high-risk behaviors, suicidality, Oppositional Defiant Disorder, and other mental health/behavioral issues.  Our primary therapeutic modalities include Dialectical Behavior Therapy (DBT) and Cognitive Behavioral Therapy. Teens participate  in individual and family therapy at least 5 times a week; weekly psychiatry sessions; daily groups (on DBT Skills Training, Anger Management, Seeking Safety, and Relapse Prevention); and daily academics. For families from out-of-state, we conduct family therapy remotely. At [facility], our goal is compassionate, high-quality treatment that will last long after your teen leaves our program. We offer daily experiential activities proven to support recovery (e.g. equine therapy, surf, art, drama, music, yoga, etc.) and 12-Step/SMART Recovery programs for those struggling with dual diagnoses. Our emphasis on safety and individualized attention are reasons why so many families from all over the country choose [facility]. [phone] [facility] Home Operator Information:  Phone: [phone]  Website: [email or web address] Certification: Not Certified Gender: [facility] Description: [facility] [place] helps families and individuals who are suffering from drug and alcohol addiction recover through programs and support to begin new lives. Please know that there is help available for you and you can start fresh, it just takes one call to get started. Do not hesitate to reach out to [employer] in [place], [place] today. Additional Information: Alcohol & drug rehab in [place], [place], can help you overcome the physical effects of substance abuse, such as withdrawal symptoms and cravings. We can give you the skills you need to lead a drug-free life. Drug rehabs provide medical treatments and limited counseling that have an immediate effect on your health. Treatments can include medications to speed the detoxification process and curb cravings. Counseling can help you recognize some of the behaviors that led to substance abuse, physical dependence, and addiction. But the benefits of drug rehab often end the moment you  walk out the door - without new life skills and an opportunity to practice clean living in a safe environment, you are at risk for a relapse. Sober living is like a bridge that helps you cross over from addiction to a life free from substance abuse. Our sober living community is a safe and supportive drug-free community that allows you to adapt to your new life at your own pace.  (Most addicts are broke) Are you Sick & Tired of Being Sick & Tired? If you are like [place], you've disappointed your family, friends, and yourself. Everyone tells you if you cared you would stop. (Like we have control). No Money No Problem Call [phone] PLEASE KNOW - YOU ARE NOT ALONE. Someone calls [facility] every minute. Fact: 1 in 7 people suffer from Addiction If you or a loved one are reading this, we know that life is not going well. Don't beat yourself up, you've been beat up enough - okay? Things are about to get better. We understand how you feel because we've been there. It often goes something like this: We mess up and tell our loved ones we are going to quit. This time we mean it (or at least we want to mean it). But then we use again. We know we should quit; we know this is going to kill [place], but we can't figure out  how. Sound familiar? [facility] is an ACTION Program. Our thinking got [place] here, and our thinking is not going to fix [place]. A wise person once said: "If you want to make some changes in your life, you have to make some changes." Smile - if you are reading this, you already started making positive changes. Okay - we are numbering these steps to make it easy. Call Tammy at [phone] - or whoever answers. (BTW every person on the phone is in recovery and has made the same call). They understand that life sucks right now. (My mom would kill me for using that word) Tell them what's going on with you. Ask them how SLA helped them. They will explain [facility]LA and tell you what city they have a bed  available. It' okay if it's not the city you want, go to that city. You can always transfer to another city when a bed is available. Don't wait - GO Mom/Dad - Our Best Program is the [employer]. It cost $1,890 a month. We know dealing with a loved one who is addicted has cost you thousands. [facility] Program is Intensive Recovery for 30-days. This includes 4 classes per day, plus group and individual sessions with a state licensed counselor. Give them the best chance at a new life. If you don't have support - it's okay. START-UP Program will give you the same intensive recovery for your first 4 days. After that, we'll connect you with one of our staffing partners After work, you will be going to 12-step meetings daily meetings, plus living around of bunch of other clean and sober people. We'll help you find a 12-step sponsor and you'll get this thing. Don't wait - Call Tammy NOW [phone] (Start Living Again)  How We help Housing - If you are like [place], we need a healthy place to live. We need to be around people who are clean & sober. [facility] connects you with roommates in recovery in a self-supporting and democratically run apartment. This is Peer to Peer Recovery. We call it the therapeutic value of one addict helping. Recovery Education Every wonder why you drink or use so much? Why you can't quit? Our Education Program will teach you the answers. We also teach life skills, spiritual, and career education. (See Education Page) [employer] If you are like most of [place], we came into [facility] with very little. No Money, No Job, No Recovery, No transportation. [facility] knows this. Our new fleet of vans help people get to work, [employer], [employer], and the store. No, we are not going to drive you anywhere you want to go - but most locations are on public transportation. Career Development Honestly to me this is more of a Jobs Program, but people said Publishing rights manager sounded better. We have  wonderful relationships with local staffing companies, who care about you. They understand what you are going through and are willing to give you another chance. (Some say a second chance, but if you're like me, you used up the second chance years ago). For some these jobs turn into a career, for others, it's a wonderful step in building the life you've always wanted. [facility]Y [facility] Benefits of [facility] Addiction Program. Services Offered [facility]  Housing Apartment Style Living with Roommates  Peer to Peer Recovery   Same Day Admission   No Funds Needed for Admission   Long-Term Programs   Career Development   Freedom to Work  Equities trader - Artist Meetings - Offsite   Licensed Counseling Catering manager Program)  Real World Experiences   Life Skills Classes   Relapse Prevention   Spiritual Development   AA Big Book Meetings   AA 12 Step Studies   Monthly Cost $800 mo. Landscape architect - Pinnacle Program)   Pinnacle Program State Licensed Counselor - 1 individual session and 2 group sessions per week 30-days focused on Recovery. No outside jobs yet. Recovery/Life Skills/Spiritual Education Outside Activities  For those with family support, our BEST product is the Pinnacle cost is $1,890 per month Pinnacle includes all the features below, Housing, Education, and [employer], plus you receive individual and group counseling with a Production designer, theatre/television/film. What is [employer]? Recovery Education THERE IS HOPE In [place], a bunch of drug addicts/alcoholics discovered a way out. They wrote a book called Alcoholics Anonymous. In the forward to the first edition they said, "to show other alcoholics (addicts) precisely how we have recovered is the main purpose of this book". In other words, they wrote a "How to Stay Clean and Sober for Dummies" book. [facility] and the wonderful 12 step sponsors will help teach you the  clear-cut instructions on how to live a happy and successful life. (News Flash - If you are in a 12-step program, then you should work the 12 steps). The Body So why do some people stop drinking/using while others can't? In the 1930's, Dr. Hollace Kinnier, Medical Director of [facility] in [place]. Said addiction is a manifestation of an allergy, and the phenomenon of craving is limited to this class of person and never occurs in the average drinker/user (see AA Big Book xxviii). To put this in 2nd grade terms (sorry that's the only way I can understand things), some people are allergic to peanut butter, some are gluten intolerant, some have shell-fish allergies (remember Will Katrinka Blazing in the [employer]?). When he consumed shellfish, his body had a reaction, his face swelled up. Likewise, when some of [place] start drinking or taking pain pills, we have an allergic reaction, and that reaction is that we want another one (more more more). Duh, right? (News Flash - that does not happen to normal people. Surgeon General says that 1 in 7 people will suffer.) Because people have never taught this wonderful discovery by Dr. Cora Daniels, our friends and loved ones tell us that if we cared, we would stop. And Lord, we agree. So, we try our use of willpower, but to no avail. Then we feel worse, we feel like failures. What happened to our willpower? Do you think a person allergic to peanut butter can use their willpower to cure themselves? Of course not! The Mind So why can't we quit? Local treatment professionals will correctly explain that your neurotransmitters - dopamine, serotonin, and endorphins are like the happy chemicals in your brain. That these chemicals dictate our actions. Dr. Cora Daniels explained it like this. When we stop using, we get Restless, Irritable, and Discontented. When this happens, your mind looks for ease and comfort. It tells [place] that a drink or pill would help. But when we use, our body have that  abnormal / allergic reaction, and the phenomenon of craving, the more more more happens. So, here we go on a drinking/drugging spree again, waking up feeling horrible. We swear to our loved ones we will never do it again. (We call that the 12-step national anthem). The Dr. says this is  repeated over and over again, and unless this person can experience and entire physic change, there is very little hope of their recovery. Summary We can't drink/use because our body has an allergic reaction - an abnormal reaction called a phenomenon of craving (I want more more more), I can't stop because my mind won't let me. When I try to quit, my mind gets Restless, Irritable, and Discontented. I'm screwed. 12 step people call this Powerless. Step one says, we admitted we were powerless/screwed over our addiction, and that our lives had become unmanageable.  Career Development Honestly to me this is more of a Jobs Program, but people said Publishing rights manager sounded better. We have wonderful relationships with local staffing companies, who care about you. They understand what you are going through and are willing to give you another chance. (Some say a second chance, but if you're like me, you used up the second chance years ago). For some these jobs turn into a career, for others, it's a wonderful step in building the life you've always wanted. Our Jobs Program allows [facility] to help the addict who is broke without government handouts. It's a Pay it Forward idea. When you enter [facility] with no money, it was actually your roommates who made it possible. Then when you get on your feet, you do the same for the next newcomer.   Spiritual Education When an alcoholic/addict stops using, most are left with a spiritual void that needs to be filled. Alcohol and drugs had become our Child psychotherapist. Now that we are not using, what are we going to do? Although [facility] is a Comptroller and we are grateful to God, we  certainly did not enter recovery that way. For most of [place], it was the opposite. Why would God allow these BAD things to happen to [place]? Our parents and kids didn't deserve this! At [facility] we will help you find those answers for yourself and more. We partner with Life Lessons Over Lunch a program of [employer] and [employer]. Mardelle Matte does not shove Jesus down your throat. Instead, he provides [place] with lessons and allows [place] to decide what to believe. We love all our friends and Multimedia programmer at [employer] - Thanks [place], [place], & [place]. Our spiritual education continues when groups like the students from [facility] come to visit. They bring pizza, snacks, and allow our residents to openly share their faith or the lack thereof. We also have wonderful small groups and local pastors who visit. They don't care if we are Jewish, Muslim, Hindu, or even atheist. They love [place] and take time for [place] anyway - It's a beautiful thing. We love all of you guys too - THANKS There is a vast amount of positive spiritual material out there. To name a few, Delrae Rend, Enedina Finner, Ainsley Spinner, [address], Zig Ziglar, Og Mandino, [place], and many more.  Transportation Services If you are like most of [place], we came into [facility]LA with very little. No Money, No Job, No Recovery, No transportation. [facility] knows this. Our new fleet of vans help people get to work, [employer], [employer], and the store. No, we are not going to drive you anywhere you want to go - but most locations are on public transportation. We hope this gives you a better understanding of what life is like at [facility]. To summarize A home Recovery A job A Presenter, broadcasting ya'll this is starting to get long.  We should write a book about this one day. (Or study the Alcoholics Anonymous Book and find some old-timer to explain it) Life Skills involved things like making your bed, keeping your apartment neat and clean, getting along with roommates,  showing up to work and recovery meetings on time. Having honor; doing what you say you're going to do, and showing up when we say we are going to show up. (I sure didn't do that when I was using). Life skills along with Recovery Education continues with feeding our minds with positive thoughts instead of negative ones. Learning how to show love, patience, and tolerance for those around us. People are going to let us down, but then again, we let people down too - yes? Try love and patience instead of getting angry, even though they don't deserve it. You'll be much happier when you do.   HOW IT WORKS - [facility]  [facility] provides food, shelter, clothing, and recovery services to those seeking help for addiction to drugs and/or alcohol. Detox and our long-term residential recovery program are offered at no cost to the client. Outpatient Services accepts [facility] [place]. There are also private pay options available. We want there to be as few barriers to recovery as possible.  DETOX The primary purpose of detox is to detoxify and stabilize an individual from drugs and/or alcohol. The secondary purpose of detox is to prepare the client to enter into one of our programs and to a life in recovery. This is when the clients begin to identify a common problem and a common solution.  LONG-TERM RESIDENTIAL RECOVERY PROGRAM MOTIVATIONAL TRACK - SOBER180SM Clients accepted into [facility]'s long-term recovery program begin the program in the [facility] portion of the program. During this 14-day period, clients feel more protected by remaining on property except for court dates and medical appointments. While in [facility], clients attend approximately 80 classes and 12-Step meetings while they become acclimated to [facility] environment. Safe Haven clients are housed with those who have already begun the motivational phase of our program and begin bonding with others in the  program. In the motivational phase, called Off the Streets (OTS), clients work with peers in similar circumstances to motivate one another to adopt social skills and learn core principles central to Alcoholics Anonymous and Narcotics Anonymous. While in OTS, our clients come to understand the concept of the physical allergy. Day classes are held off campus at [facility] located at [address] [place] [place] (men) and [address] [place] (women). These classes, which use the SOBER180 curriculum, are where clients begin accepting their self-centered disease problem and its spiritual solution. Our clients also learn the basics of responsibility and move away from a "street" mentality. Along the way, they make a commitment to the solution.  RECOVERY STAGE - SOBER180SM The recovery stage, called Phase I, is where clients learn how to apply the 12 Steps of Alcoholics Anonymous and Narcotics Anonymous in their lives with the [id number] curriculum. This curriculum consists of classes and written assignments. All clients are assisted throughout this process by Peer Mentors, who are men and women who recently completed [facility] recovery program. The first part of Phase I stresses strong personal accountability - being on time for classes and meetings, completing job assignments, etc. - and encourages clients to look at their own behavior. This is facilitated at the [facility] meeting. The second part of Phase I focuses on interpersonal skills, stressing concern  and accountability for others in the program. This is achieved through role modeling, holding peers accountable for their actions, and giving support to others.  OUTPATIENT SERVICES We understand that not everyone has time for, or can afford, an inpatient treatment program due to job commitments, school, and family. We also understand that not everyone qualifies for inpatient treatment. Our Outpatient Services staff will work with clients to develop a  treatment plan that is best for them and their schedule. Our services are designed to facilitate time management, increase accountability, and develop life skills.  INTENSIVE OUTPATIENT GROUPS Our intensive outpatient groups are three hours long and take place four days every week. Each group is made up of 8-15 people and focuses on practical ways clients can apply recovery principles in their life. Our IOP lasts approximately six to eight weeks and during this time, we help clients develop a strong recovery support network. We offer flexible group times in order to fit their schedule.  PEER SUPPORT Our team of Peer Support Specialists meet with clients regularly throughout the week in individual and group settings to help them navigate the recovery process by role modeling healthy behaviors.  CASE MANAGEMENT Substance abuse often creates problems that make recovery more difficult. Case management addresses those issues so clients can focus on their recovery. Our team helps with stable employment, medical care, housing, legal assistance, and other issues.  INDIVIDUAL COUNSELING We also offer individual counseling. A counselor is available for clients to talk to about any thoughts or feelings related to their recovery that they would prefer to discuss outside a group setting.  SOBER HOUSING Safe and stable housing is a crucial element to early sobriety. We provide sober housing to all clients engaged in our intensive outpatient program at no cost. We also provide housing, at a very affordable rate, to anyone who needs it once formal clinical treatment is completed. Meals are also provided at no cost.  TRANSITIONAL CARE Once a client completes the program, he or she can choose to become a Audiological scientist. Peer Research scientist (medical) to serve as Multimedia programmer and role models for those who are newer in the program. Peer Mentors teach classes, monitor assignments, coordinate job assignments, and work one-on-one with  individuals moving through the recovery process. Peer Mentors demonstrate [facility] philosophy that the best solution is one alcoholic or addict reaching back to help another on the journey to recovery. Clients who complete our program and choose not to become a Peer Mentor are able to continue to live on property at a small cost while finding work, allowing them to transition slowly back into life. All clients who complete our program receive support and assistance from our continuing care staff, including help with finding employment, housing, and navigating legal and medical issues.  GET HELP NOW [facility] [address] [place], [place] [address] [phone] [facility] [address] [place], [place] [address] [phone] [facility] [address] [place], [place] [address] [phone] Outpatient Services [address] [place], [place] [address] [phone]   [facility] [address] [place], [place] [address] Phone: [phone] Taking the First Step For many people, taking the essential first step of seeking treatment for substance abuse and co-occurring mental health disorders is often the most difficult. As a result, we, at [facility], have designed our admissions process to be as smooth and painless as possible. As a primary substance abuse treatment center, we treat people who are struggling with virtually all  forms of substance abuse and addiction. If a person is interested in our services, the admissions process begins with a phone call and a brief, confidential conversation with one of the compassionate and knowledgeable members of our admissions team. Our skilled admissions team is available to discuss treatment and schedule admissions 24 hours per day, seven days per week, even on holidays. Patients, family members, loved ones, and/or referral sources will be asked a series of questions so we can get to know the  potential patient. These questions are designed to help [place] get a picture of what is happening with the potential patient so we can determine the best course of treatment and assess whether we would be a good fit. These questions include a history of alcohol or drug use, medical background, treatment history, and insurance information. Our Admissions Process Soon after this initial call, we process insurance information and contact the potential patient to discuss financial issues. Once this step is complete, we can schedule a date for admission. Because of our streamlined process, we can often schedule an admission date the same day as the initial call. Our Admissions Criteria [facility] is open to men and women ages 61 and older who have a primary substance use disorder diagnosis. We also accept men and women with both a primary substance use disorder and co-occurring mental health diagnoses. We are unable to provide care for individuals who are homicidal or those who have a history of sex offenses. All of our rooms are handicapped accessible. If you or your loved one is struggling with a substance use disorder, do not hesitate to give [place] a call. Today could be first day of your journey toward the substance-free life that you deserve.

## 2023-02-16 NOTE — Progress Notes (Signed)
   02/16/23 1837  [facility] Triage Screening (Walk-ins at [facility] only)  How Did You Hear About [place]? Self  What Is the Reason for Your Visit/Call Today? ROUTINE: Molly Marshall is a 27 y/o female that presents to [facility]. Patient with history of depression, anxiety, and substance abuse. She presents to the [facility] for an assessment. Patient says that her drug of choice is methamphetamine. She started using methamphetamine at the age of 27 years old. She reports daily use for the past several weeks (2 lines daily) and last use was 2 days ago. She uses cocaine and started use at the at the age of 27 years old. She reports daily cocaine use of 2 grams per use and last use was today. She uses CBD 4x's per week, 3-4 grams per use,  and last use was 2 days ago. Lastly, she mentions use of unprescribed percocet for tooth pain 2 weeks ago. Denies use of alcohol. No SI, HI, and AVH's. Previously, received outpatient services at [facility] ([facility]) but dismissed from the program due continued drug use. She spent several months in residential program [facility]), several years ago. She does not have a psychiatrist or therapist at present. States that the main reason she is here today is because her family is forcing her to admit herself for detox. She has no interest in admitting herself today and would like outpatient resources.  How Long Has This Been Causing You Problems? 1-6 months  Have You Recently Had Any Thoughts About Hurting Yourself? No  Are You Planning to Commit Suicide/Harm Yourself At This time? No  Have you Recently Had Thoughts About Hurting Someone Karolee Ohs? No  Are You Planning To Harm Someone At This Time? No  Are you currently experiencing any auditory, visual or other hallucinations? No  Have You Used Any Alcohol or Drugs in the Past 24 Hours? Yes  How long ago did you use Drugs or Alcohol? Yesterday  What Did You Use and How Much? Patient says that her drug of choice is methamphetamine. She started  using methamphetamine at the age of 27 years old. She reports daily use for the past several weeks (2 lines daily) and last use was 2 days ago. She uses cocaine and started use at the at the age of 27 years old. She reports daily cocaine use of 2 grams per use and last use was today. She uses CBD 4x's per week, 3-4 grams per use,  and last use was 2 days ago. Lastly, she mentions use of unprescribed percocet for tooth pain 2 weeks ago. Denies use of alcohol.  Do you have any current medical co-morbidities that require immediate attention? No  Clinician description of patient physical appearance/behavior: Patient is calm, cooperative, pleasant, AA0x5  What Do You Feel Would Help You the Most Today? Alcohol or Drug Use Treatment  If access to [facility] was not available, would you have sought care in the Emergency Department? No  Determination of Need Urgent (48 hours)  Options For Referral Medication Management;Facility-Based Crisis;Chemical Dependency Intensive Outpatient Therapy ([facility]IOP)

## 2023-02-16 NOTE — ED Provider Notes (Signed)
Behavioral Health Urgent Care Medical Screening Exam  Patient Name: Molly Marshall MRN: [id number] Date of Evaluation: 02/16/23 Chief Complaint:  Polysubstance abuse Diagnosis:  Final diagnoses:  Polysubstance dependence including opioid type drug without complication, episodic abuse  Needs assistance with community resources    History of Present illness: Molly Marshall is a 27 y.o. female patient presented to [facility] as a walk in with complaints of substance abuse and seeking resources  Molly Marshall, 27 y.o., female patient seen face to face by this provider, consulted with Dr. Nelly Rout; and chart reviewed on 02/16/23.  On evaluation Molly Marshall reports she was brought her by her mother related to her drug use.  Patient states she has been using drugs since the age of 38 or 79 "and I'm not ready to quit."  Patient states that she use meth, cocaine, and opiates.  Reporting  methamphetamine drug of choice, with daily use for the past several weeks (2 lines daily) and last use was 2 days ago.  Cocaine daily use, 2 grams per use and last use was today.  She reports She uses CBD 4x's per week, 3-4 grams per use, and last use was 2 days ago.  Also uses pain pills occasionally.  Recent unprescribed Percocet for tooth pain 2 weeks ago.  Patient reports she also has a history of shooting up, but it has been 2 or 3 years since she has done that.  States that she has had one long term rehab while she was pregnant, [facility] and was clean up to couple months ago when she relapsed and has been using on and off since then.  States that she was attending an IOP program at [facility] but stopped when informed that could not continue program and use. "But they said I could come back when I was ready." Patient states that she lives with her parents and has a 2 yr. old daughter.  States that the main reason she is here today is because she was forced to come her by  her family but currently, she has no interest in detox or long-term rehab.  States she is willing to take resources and look over them so when she is ready, she can reach out.  Patient denies suicidal/self-harm/homicidal ideation, psychosis, and paranoia.    During evaluation Molly Marshall is sitting in chair with no noted distress.  She is alert/oriented x 4, calm, cooperative, attentive, and responses were relevant and appropriate to assessment questions.  She spoke in a clear tone at moderate volume, and normal pace, with good eye contact.   She denies suicidal/self-harm/homicidal ideation, psychosis, and paranoia.  Objectively:  there is no evidence of psychosis/mania or delusional thinking.  She conversed coherently, with goal directed thoughts, and no distractibility, or pre-occupation.  At this time Molly Marshall is educated and verbalizes understanding of mental health resources and other crisis services in the community. She is instructed to call 911 and present to the nearest emergency room should she experience any suicidal/homicidal ideation, auditory/visual/hallucinations, or detrimental worsening of her mental health condition.  Resource given for outpatient, short term, long term rehab services   Flowsheet Row ED from 02/16/2023 in [facility]  C-SSRS RISK CATEGORY No Risk       Psychiatric Specialty Exam  Presentation  General Appearance:Appropriate for Environment; Casual  Eye Contact:Good  Speech:Clear and Coherent; Normal Rate  Speech Volume:Normal  Handedness:Right   Mood and Affect  Mood: Euthymic  Affect: Appropriate; Congruent   Thought Process  Thought Processes: Coherent  Descriptions of Associations:Intact  Orientation:Full (Time, Place and Person)  Thought Content:Logical    Hallucinations:None  Ideas of Reference:None  Suicidal Thoughts:No  Homicidal Thoughts:No   Sensorium   Memory: Immediate Good; Recent Good; Remote Good  Judgment: Intact  Insight: Present   Executive Functions  Concentration: Good  Attention Span: Good  Recall: Good  Fund of Knowledge: Good  Language: Good   Psychomotor Activity  Psychomotor Activity: Normal   Assets  Assets: Communication Skills; Desire for Improvement; Housing; Physical Health; Resilience; Social Support; Transportation; Leisure Time   Sleep  Sleep: Good  Number of hours: No data recorded  Physical Exam: Physical Exam Vitals and nursing note reviewed.  Constitutional:      General: She is not in acute distress.    Appearance: Normal appearance. She is not ill-appearing.  HENT:     Head: Normocephalic.  Eyes:     Conjunctiva/sclera: Conjunctivae normal.  Cardiovascular:     Rate and Rhythm: Normal rate.  Pulmonary:     Effort: Pulmonary effort is normal. No respiratory distress.  Musculoskeletal:        General: Normal range of motion.     Cervical back: Normal range of motion.  Skin:    General: Skin is warm and dry.  Neurological:     Mental Status: She is alert and oriented to person, place, and time.  Psychiatric:        Attention and Perception: Attention and perception normal. She does not perceive auditory or visual hallucinations.        Mood and Affect: Mood and affect normal.        Speech: Speech normal.        Behavior: Behavior normal. Behavior is cooperative.        Thought Content: Thought content normal. Thought content is not paranoid or delusional. Thought content does not include homicidal or suicidal ideation.        Cognition and Memory: Cognition normal.        Judgment: Judgment normal.   Review of Systems  Constitutional:        No reported complaints  Psychiatric/Behavioral:  Positive for substance abuse (Cocaine, meth, opiates). Negative for depression, hallucinations and suicidal ideas. The patient is not nervous/anxious and does not have  insomnia.        Stating she is here only because he family made her come her.  Not interested in substance use services at this time   All other systems reviewed and are negative.  Blood pressure 130/84, pulse 71, temperature 98 F (36.7 C), temperature source Oral, resp. rate 18, SpO2 100 %. There is no height or weight on file to calculate BMI.  Musculoskeletal: Strength & Muscle Tone: within normal limits Gait & Station: normal Patient leans: N/A   [facility] MSE Discharge Disposition for Follow up and Recommendations: Based on my evaluation the patient does not appear to have an emergency medical condition and can be discharged with resources and follow up care in outpatient services for Substance Abuse Intensive Outpatient Program   Molly Szeliga, NP 02/16/2023, 6:48 PM
